# Patient Record
Sex: Male | Born: 1960 | Race: White | Hispanic: No | Smoking: Former smoker
Health system: Southern US, Community
[De-identification: ages and names within clinical notes are randomized; demographics above are authoritative.]

## PROBLEM LIST (undated history)

## (undated) DIAGNOSIS — I480 Paroxysmal atrial fibrillation: Secondary | ICD-10-CM

## (undated) DIAGNOSIS — F419 Anxiety disorder, unspecified: Secondary | ICD-10-CM

## (undated) DIAGNOSIS — R0602 Shortness of breath: Secondary | ICD-10-CM

## (undated) DIAGNOSIS — R42 Dizziness and giddiness: Secondary | ICD-10-CM

## (undated) DIAGNOSIS — I491 Atrial premature depolarization: Secondary | ICD-10-CM

## (undated) DIAGNOSIS — R5383 Other fatigue: Secondary | ICD-10-CM

## (undated) DIAGNOSIS — R079 Chest pain, unspecified: Secondary | ICD-10-CM

## (undated) HISTORY — PX: BACK SURGERY: SHX140

## (undated) HISTORY — DX: Atrial premature depolarization: I49.1

## (undated) HISTORY — PX: HERNIA REPAIR: SHX51

## (undated) HISTORY — DX: Shortness of breath: R06.02

## (undated) HISTORY — PX: INGUINAL HERNIA REPAIR: SUR1180

## (undated) HISTORY — DX: Paroxysmal atrial fibrillation: I48.0

## (undated) HISTORY — DX: Other fatigue: R53.83

## (undated) HISTORY — PX: MOHS SURGERY: SHX181

## (undated) HISTORY — DX: Dizziness and giddiness: R42

## (undated) HISTORY — PX: COLONOSCOPY: SHX174

## (undated) HISTORY — DX: Anxiety disorder, unspecified: F41.9

## (undated) HISTORY — PX: LUMBAR DISC SURGERY: SHX700

## (undated) HISTORY — DX: Chest pain, unspecified: R07.9

---

## 2000-09-21 ENCOUNTER — Emergency Department (HOSPITAL_COMMUNITY): Admission: EM | Admit: 2000-09-21 | Discharge: 2000-09-21 | Payer: Self-pay | Admitting: Emergency Medicine

## 2001-01-21 ENCOUNTER — Encounter: Payer: Self-pay | Admitting: Specialist

## 2001-01-21 ENCOUNTER — Ambulatory Visit (HOSPITAL_COMMUNITY): Admission: RE | Admit: 2001-01-21 | Discharge: 2001-01-21 | Payer: Self-pay | Admitting: Specialist

## 2003-10-01 ENCOUNTER — Emergency Department (HOSPITAL_COMMUNITY): Admission: EM | Admit: 2003-10-01 | Discharge: 2003-10-01 | Payer: Self-pay | Admitting: Emergency Medicine

## 2003-10-01 ENCOUNTER — Emergency Department (HOSPITAL_COMMUNITY): Admission: EM | Admit: 2003-10-01 | Discharge: 2003-10-02 | Payer: Self-pay | Admitting: Emergency Medicine

## 2005-11-14 ENCOUNTER — Encounter: Admission: RE | Admit: 2005-11-14 | Discharge: 2005-11-14 | Payer: Self-pay | Admitting: Family Medicine

## 2009-05-14 ENCOUNTER — Emergency Department (HOSPITAL_COMMUNITY): Admission: EM | Admit: 2009-05-14 | Discharge: 2009-05-14 | Payer: Self-pay | Admitting: Emergency Medicine

## 2009-05-16 ENCOUNTER — Ambulatory Visit: Payer: Self-pay | Admitting: Internal Medicine

## 2009-05-16 DIAGNOSIS — R079 Chest pain, unspecified: Secondary | ICD-10-CM | POA: Insufficient documentation

## 2009-05-16 DIAGNOSIS — R002 Palpitations: Secondary | ICD-10-CM | POA: Insufficient documentation

## 2009-05-16 DIAGNOSIS — I471 Supraventricular tachycardia: Secondary | ICD-10-CM | POA: Insufficient documentation

## 2009-05-21 ENCOUNTER — Telehealth (INDEPENDENT_AMBULATORY_CARE_PROVIDER_SITE_OTHER): Payer: Self-pay | Admitting: *Deleted

## 2009-05-22 ENCOUNTER — Ambulatory Visit: Payer: Self-pay

## 2009-05-22 ENCOUNTER — Ambulatory Visit: Payer: Self-pay | Admitting: Cardiovascular Disease

## 2009-05-22 ENCOUNTER — Ambulatory Visit: Payer: Self-pay | Admitting: Internal Medicine

## 2009-05-22 ENCOUNTER — Encounter (HOSPITAL_COMMUNITY): Admission: RE | Admit: 2009-05-22 | Discharge: 2009-08-01 | Payer: Self-pay | Admitting: Internal Medicine

## 2009-05-24 ENCOUNTER — Telehealth (INDEPENDENT_AMBULATORY_CARE_PROVIDER_SITE_OTHER): Payer: Self-pay | Admitting: *Deleted

## 2009-05-24 DIAGNOSIS — R9431 Abnormal electrocardiogram [ECG] [EKG]: Secondary | ICD-10-CM | POA: Insufficient documentation

## 2009-05-24 LAB — CONVERTED CEMR LAB
Cholesterol: 180 mg/dL (ref 0–200)
HDL: 34.4 mg/dL — ABNORMAL LOW (ref >39.00)
LDL Cholesterol: 121 mg/dL — ABNORMAL HIGH (ref 0–99)
TSH: 3.52 microintl units/mL (ref 0.35–5.50)
Total CHOL/HDL Ratio: 5
Triglycerides: 123 mg/dL (ref 0.0–149.0)
VLDL: 24.6 mg/dL (ref 0.0–40.0)

## 2009-06-07 ENCOUNTER — Encounter: Payer: Self-pay | Admitting: Internal Medicine

## 2009-06-07 ENCOUNTER — Ambulatory Visit: Payer: Self-pay

## 2009-06-07 ENCOUNTER — Ambulatory Visit (HOSPITAL_COMMUNITY): Admission: RE | Admit: 2009-06-07 | Discharge: 2009-06-07 | Payer: Self-pay | Admitting: Internal Medicine

## 2009-06-07 ENCOUNTER — Ambulatory Visit: Payer: Self-pay | Admitting: Cardiology

## 2009-07-03 ENCOUNTER — Ambulatory Visit: Payer: Self-pay | Admitting: Internal Medicine

## 2010-05-28 NOTE — Assessment & Plan Note (Signed)
Summary: Cardiology Nuclear Study  Nuclear Med Background Indications for Stress Test: Evaluation for Ischemia, Post Hospital  Indications Comments: 05/14/09 [facility]ED with palps and chest pain   History Comments: NO DOCUMENTED CAD  Symptoms: Chest Pain, Fatigue, Light-Headedness, Palpitations  Symptoms Comments: Last episode of ZO:XWRU since discharge.   Nuclear Pre-Procedure Cardiac Risk Factors: Lipids Caffeine/Decaff Intake: None NPO After: 12:00 AM Lungs: Clear IV 0.9% NS with Angio Cath: 20g     IV Site: (R) AC IV Started by: Irean Hong RN Chest Size (in) 45     Height (in): 75 Weight (lb): 232 BMI: 29.10 Tech Comments: Family history of father with WPW  Nuclear Med Study 1 or 2 day study:  1 day     Stress Test Type:  Stress Reading MD:  Charlton Haws, MD     Referring MD:  Berton Mount, MD Resting Radionuclide:  Technetium 62m Tetrofosmin     Resting Radionuclide Dose:  10.4 mCi  Stress Radionuclide:  Technetium 72m Tetrofosmin     Stress Radionuclide Dose:  33.0 mCi   Stress Protocol Exercise Time (min):  10:00 min     Max HR:  166 bpm     Predicted Max HR:  173 bpm  Max Systolic BP: 180 mm Hg     Percent Max HR:  95.95 %     METS: 12.0 Rate Pressure Product:  04540    Stress Test Technologist:  Rea College CMA-N     Nuclear Technologist:  Burna Mortimer Deal RT-N  Rest Procedure  Myocardial perfusion imaging was performed at rest 45 minutes following the intravenous administration of Myoview Technetium 38m Tetrofosmin.  Stress Procedure  The patient exercised for ten minutes.  The patient stopped due to fatigue and denied any chest pain.  There were no diagnostic ST-T wave changes, only nonspecific upsloping and rare PAC's.  Myoview was injected at peak exercise and myocardial perfusion imaging was performed after a brief delay.  QPS Raw Data Images:  Normal; no motion artifact; normal heart/lung ratio. Stress Images:  NI: Uniform and normal uptake of tracer in all  myocardial segments. Rest Images:  Normal homogeneous uptake in all areas of the myocardium. Subtraction (SDS):  Normal Transient Ischemic Dilatation:  .99  (Normal <1.22)  Lung/Heart Ratio:  .34  (Normal <0.45)  Quantitative Gated Spect Images QGS EDV:  144 ml QGS ESV:  71 ml QGS EF:  50 % QGS cine images:  Apical hypokinesis  Findings Low risk nuclear study   Evidence for LV Dysfunction LV Dysfunction    Overall Impression  Exercise Capacity: Good exercise capacity. BP Response: Normal blood pressure response. Clinical Symptoms: Fatigue ECG Impression: No significant ST segment change suggestive of ischemia. Overall Impression: No ischemia or infarction but decreased EF Overall Impression Comments: Suggest echo or MRI correlation of EF LVH on ECG  Appended Document: Cardiology Nuclear Study Pt aware. ECHO ordered per Dr. Graciela Husbands

## 2010-05-28 NOTE — Progress Notes (Signed)
  Phone Note Outgoing Call   Call placed by: Duncan Dull, RN, BSN,  May 24, 2009 2:45 PM Call placed to: Patient Summary of Call: s/w wife to give her the labs results and Nuc Study results. Pt needs an ECHO per Dr. Graciela Husbands  New Problems: ABNORMAL STRESS ELECTROCARDIOGRAM (ICD-794.31)   New Problems: ABNORMAL STRESS ELECTROCARDIOGRAM (ICD-794.31)

## 2010-05-28 NOTE — Assessment & Plan Note (Signed)
Summary: nep/palps/see er visit/jss   CC:  nep/palpitations. Pt states that on the 17th he had palpitations that would not go away.  He says these episodes are usually infrequent and very short lived.  Marland Kitchen  History of Present Illness: we are asked to see Larry Mathis today at the request of the emergency room because of palpitations.  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 He describes his "as a pause". They would occur a couple of times over over a span of a week or 2 and it would be quiet for a month or 2.  Earlier this week he had the onset of palpitations that were very much similar but a much more rapid and persistent frequency than previously. It was associated with a dull chest discomfort that did radiated into the axilla but not the jaw or the shoulder and was not associated with diaphoresis shortness of breath or lightheadedness.  he has no exertional chest discomfort although his work is primarily anaerobic. There is no family history of coronary disease.  He also has a history of intermittent and non-predictable lightheadedness.  His wife says that his recovery following exercise and heavy weight lifting a somewhat longer. He denies any change in her aerobic activity. There've been no peripheral edema nocturnal dyspnea  Current Medications (verified): 1)  Fish Oil 1000 Mg Caps (Omega-3 Fatty Acids) .... Once Daily 2)  Glucosamine 500 Mg Caps (Glucosamine Sulfate) .... Once Daily 3)  Multi-Enzyme  Tabs (Digestive Enzymes) .... Once Daily 4)  Saw Palmetto 450 Mg Caps (Saw Palmetto (Serenoa Repens)) .... Once Daily 5)  Dhea 25 Mg Caps (Prasterone (Dhea)) .... 3 Times Weekly 6)  Vita-Plus E 400 Unit Caps (Vitamin E) .... Once Daily  Allergies (verified): No Known Drug Allergies  Past History:  Past Medical History: Last updated: 05/15/2009 palpitations  Social History: Last updated:  05/16/2009 Tobacco Use - No.  Alcohol Use - yes-occasional Drug Use - no married without children  Past Surgical History: back surgery December 1995  Family History: Father: WPW  Social History: Tobacco Use - No.  Alcohol Use - yes-occasional Drug Use - no married without children  Review of Systems       full review of systems was negative apart from a history of present illness and past medical history.   Vital Signs:  Patient profile:   50 year old male Height:      75 inches Weight:      236 pounds BMI:     29.60 Pulse rate:   66 / minute Pulse rhythm:   irregular BP sitting:   130 / 74  (right arm) Cuff size:   large  Vitals Entered By: Larry Mathis CMA (May 16, 2009 10:36 AM)  Physical Exam  General:  Alert and oriented very well-developed middle-age Caucasian male appearing his stated age in no acute distress. HEENT  normal . Neck veins were flat; carotids brisk and full without bruits. No lymphadenopathy.No thyromegaly. No CVA tenderness. Back without kyphosis. Lungs clear. Heart sounds regular without murmurs or gallops. PMI nondisplaced. Abdomen soft with active bowel sounds without midline pulsation or hepatomegaly. Femoral pulses and distal pulses intact. Extremities were without clubbing cyanosis or edemaSkin warm and dry. Neurological exam grossly normal; affect was normal    EKG  Procedure date:  05/16/2009  Findings:      sinus rhythm at 66 Intervals 0.13/0.09/0.37 Axis  LXXXIV No evidence of ventricular preexcitation  Impression & Recommendations:  Problem # 1:  PALPITATIONS (ICD-785.1) By description the patient has premature ventricular contractions. The patient is quite anxious about them. We will plan to undertake an event recorder to try to clarify the mechanisms of this.  In the event that we find him p.r.n. medication might make the most sense given the infrequency of severe symptoms.   Orders: EKG w/ Interpretation  (93000) Nuclear Stress Test (Nuc Stress Test) Event (Event)  Problem # 2:  CHEST PAIN (ICD-786.50) I suspect the chest pain was related to the arrhythmia and is not related to coronary artery disease. However, given the Q waves on his lipid cardiogram and his age we will plan to undertake stress testing. There is minor baseline ST segment changes which might make interpretation of a non-imaging study difficult.  Problem # 3:  Family Hx of WPW (ICD-426.7) given his family history of W PW, we have to be concerned in this patient as well as his brother that reentrant tachycardia is possible. It certainly does not sound like that  Patient Instructions: 1)  Your physician has requested that you have an exercise stress myoview.  For further information please visit [email or web address]  Please follow instruction sheet, as given. 2)  Your physician recommends that you return for lab ON THE DAY OF YOUR STRESS TEST: FASTING LIPIDS, TSH 3)  Your physician has recommended that you wear an event monitor ([employer] HE[employer]).  Event monitors are medical devices that record the heart's electrical activity. Doctors most often use these monitors to diagnose arrhythmias. Arrhythmias are problems with the speed or rhythm of the heartbeat. The monitor is a small, portable device. You can wear one while you do your normal daily activities. This is usually used to diagnose what is causing palpitations/syncope (passing out). 4)  Your physician recommends that you schedule a follow-up appointment in: 5 WEEKS

## 2010-05-28 NOTE — Assessment & Plan Note (Signed)
Summary: ok per mel/per check out/saf   CC:  rov/ pt follow up on monitor/.  History of Present Illness: Ms. Hessling is seen in followup for palpitations and atypical chest pain.  Since he was seen in January he underwent nuclear testing which demonstrated no ischemia but a concern for left ventricular dysfunction with a measured ejection fraction of 50%. Ultrasound was then undertaken to clarify ejection fraction it was demonstrated at 60%.  He is otherwise without symptoms  Current Medications (verified): 1)  Fish Oil 1000 Mg Caps (Omega-3 Fatty Acids) .... Once Daily 2)  Glucosamine 500 Mg Caps (Glucosamine Sulfate) .... Once Daily 3)  Multi-Enzyme  Tabs (Digestive Enzymes) .... Once Daily 4)  Saw Palmetto 450 Mg Caps (Saw Palmetto (Serenoa Repens)) .... Once Daily 5)  Dhea 25 Mg Caps (Prasterone (Dhea)) .... 3 Times Weekly 6)  Vita-Plus E 400 Unit Caps (Vitamin E) .... Once Daily  Allergies (verified): No Known Drug Allergies  Past History:  Past Medical History: Last updated: 05/15/2009 palpitations  Past Surgical History: Last updated: 05/16/2009 back surgery December 1995  Family History: Last updated: 05/16/2009 Father: WPW  Social History: Last updated: 05/16/2009 Tobacco Use - No.  Alcohol Use - yes-occasional Drug Use - no married without children  Vital Signs:  Patient profile:   50 year old male Height:      75 inches Weight:      239 pounds BMI:     29.98 Pulse rate:   72 / minute Pulse rhythm:   regular BP sitting:   128 / 74  (left arm) Cuff size:   large  Vitals Entered By: Judithe Modest CMA (July 03, 2009 10:00 AM)  Physical Exam  General:  The patient was alert and oriented in no acute distress. HEENT Normal.  Neck veins were flat, carotids were brisk.  Lungs were clear.  Heart sounds were regular without murmurs or gallops.  Abdomen was soft with active bowel sounds. There is no clubbing cyanosis or edema. Skin Warm and  dry    Impression & Recommendations:  Problem # 1:  PALPITATIONS (ICD-785.1) these do not correlate with PACs. We discussed the role of caffeine stress as potential triggers.  Problem # 2:  CHEST PAIN (ICD-786.50) Myoview scan was negative. EF was reported as low. Echo clarified normal left ventricularfunction

## 2010-05-28 NOTE — Progress Notes (Signed)
Summary: Nuclear Pre-Procedure  Phone Note Outgoing Call Call back at [facility] Phone [phone]   Call placed by: Stanton Kidney, EMT-P,  May 21, 2009 2:58 PM Action Taken: Phone Call Completed Summary of Call: Reviewed information on Myoview Information Sheet (see scanned document for further details).  Spoke with Patient's wife.    Nuclear Med Background Indications for Stress Test: Evaluation for Ischemia     Symptoms: Chest Pain, Light-Headedness, Palpitations    Nuclear Pre-Procedure Height (in): 75

## 2013-08-23 ENCOUNTER — Encounter: Payer: Self-pay | Admitting: *Deleted

## 2013-08-26 ENCOUNTER — Ambulatory Visit (INDEPENDENT_AMBULATORY_CARE_PROVIDER_SITE_OTHER): Payer: BC Managed Care – PPO | Admitting: Internal Medicine

## 2013-08-26 ENCOUNTER — Encounter: Payer: Self-pay | Admitting: *Deleted

## 2013-08-26 ENCOUNTER — Encounter: Payer: Self-pay | Admitting: Internal Medicine

## 2013-08-26 VITALS — BP 131/84 | HR 68 | Ht 75.0 in | Wt 222.0 lb

## 2013-08-26 DIAGNOSIS — R002 Palpitations: Secondary | ICD-10-CM

## 2013-08-26 NOTE — Patient Instructions (Addendum)
Your physician recommends that you continue on your current medications as directed. Please refer to the Current Medication list given to you today.  Your physician recommends that you have CT calcium score test performed. (follow up to be determined after testing)

## 2013-08-26 NOTE — Progress Notes (Signed)
.  sfk      Patient Care Team: Doreene Nest, MD as PCP - General (Pediatrics)   HPI  Larry Mathis is a 53 y.o. male Seen after a hiatus of a number of years because of recurrent palpitations. Although they are not available, the patient recalls me having told him that monitoring demonstrated PaCs.  He is having recurrent palpitations over the last 9 months. which are not necessarily related to exertion. They're occasionally accompanied by lightheadedness.  They're much less problematic since his boss retired a few months ago. He is also decreased his coffee. He is working on keeping well hydrated as this seems to make it worse.  On some occasions he has noted discomfort in his left shoulder radiating into his left neck. This may be related to palpitations. There is some residual chest soreness.  He has lost 20 pounds or so; he has stopped snoring but still has daytime somnolence. He occasionally falls asleep at the table even with company.  There has been a fair amount of lassitude and lack of enthusiasm. His wife is asked on occasion whether he is depressed. Past Medical History  Diagnosis Date  . Arrhythmia   . Chest pain   . Rapid heart rate   . Lightheadedness   . SOB (shortness of breath)   . Dizziness   . Fatigue   . Anxiety     Past Surgical History  Procedure Laterality Date  . Back surgery      No current outpatient prescriptions on file.   No current facility-administered medications for this visit.   Fhx neg for heart dis  Soc hx  Works at [employer] negative except from HPI and PMH  Physical Exam BP 131/84  Pulse 68  Ht 6\' 3"  (1.905 m)  Wt 222 lb (100.699 kg)  BMI 27.75 kg/m2 Well developed and nourished in no acute distress HENT normal Neck supple with JVP-flat Clear Regular rate and rhythm, no murmurs or gallops Abd-soft with active BS No Clubbing cyanosis edema Skin-warm and dry A & Oriented  Grossly normal  sensory and motor function  ECG demonstrates sinus rhythm at 57 Intervals 13/09/40  Assessment and  Plan  Palpitations  Fatigue  Chest pain -atypical  Daytime somnolence  The patient has a history of documented PACs causing palpitations. His symptoms are similar or perhaps more severe of late. They are largely attenuated however, with the recent retirement of his boss. He is also decrease his caffeine intake. This is very reassuring. I suspect they're PACs again or PVCs. In either case he is reassured.   He has atypical chest pain. Inferolateral Q waves are old and would have no impact on stress testing. However, we have discussed also calcium scoring  ; we will pursue that. In the event that that is not normal, we'll undertake standard stress testing  I've also asked him to consider the role of a sleep study given daytime somnolence not withstanding the fact that there is less no results he is lost. In addition, I have asked him as wife to consider whether depression may explain some of the lassitude of life over the last month. He is feeling better as spring  has come forth and with the departure of his boss.  If the symptoms persist he should follow this up with his PCP

## 2013-09-02 ENCOUNTER — Ambulatory Visit (INDEPENDENT_AMBULATORY_CARE_PROVIDER_SITE_OTHER)
Admission: RE | Admit: 2013-09-02 | Discharge: 2013-09-02 | Disposition: A | Discharge status: Home / Self Care | Payer: Self-pay | Source: Ambulatory Visit | Attending: Internal Medicine | Admitting: Internal Medicine

## 2013-09-02 DIAGNOSIS — R002 Palpitations: Secondary | ICD-10-CM

## 2013-09-07 ENCOUNTER — Telehealth: Payer: Self-pay | Admitting: Internal Medicine

## 2013-09-07 NOTE — Telephone Encounter (Signed)
New message    Test results from last Friday.

## 2013-09-07 NOTE — Telephone Encounter (Signed)
Called patient with the calcium CT score ( he will let his wife know). Not bothered by PVC's now. Will call us if he has increased PVC's. Does not want to set up sleep study at this time.

## 2016-08-18 ENCOUNTER — Ambulatory Visit (INDEPENDENT_AMBULATORY_CARE_PROVIDER_SITE_OTHER): Payer: BC Managed Care – PPO | Admitting: Family Medicine

## 2016-08-18 VITALS — BP 110/72 | HR 71 | Temp 98.2°F | Resp 17 | Ht 75.0 in | Wt 215.0 lb

## 2016-08-18 DIAGNOSIS — R002 Palpitations: Secondary | ICD-10-CM | POA: Diagnosis not present

## 2016-08-18 DIAGNOSIS — K4090 Unilateral inguinal hernia, without obstruction or gangrene, not specified as recurrent: Secondary | ICD-10-CM | POA: Diagnosis not present

## 2016-08-18 NOTE — Patient Instructions (Addendum)
The area on your right lower abdomen does appear to be a hernia. I do not see any concerns with it at this time. Avoid heavy lifting as much as possible until you're seen by general surgery. See other precautions below.  For your heart palpitations, I would recommend following up with your cardiologist. If those worsen, or associated with lightheadedness or dizziness, be seen immediately here or the emergency room if needed.  Hernia, Adult A hernia is the bulging of an organ or tissue through a weak spot in the muscles of the abdomen (abdominal wall). Hernias develop most often near the navel or groin. There are many kinds of hernias. Common kinds include:  Femoral hernia. This kind of hernia develops under the groin in the upper thigh area.  Inguinal hernia. This kind of hernia develops in the groin or scrotum.  Umbilical hernia. This kind of hernia develops near the navel.  Hiatal hernia. This kind of hernia causes part of the stomach to be pushed up into the chest.  Incisional hernia. This kind of hernia bulges through a scar from an abdominal surgery. What are the causes? This condition may be caused by:  Heavy lifting.  Coughing over a long period of time.  Straining to have a bowel movement.  An incision made during an abdominal surgery.  A birth defect (congenital defect).  Excess weight or obesity.  Smoking.  Poor nutrition.  Cystic fibrosis.  Excess fluid in the abdomen.  Undescended testicles. What are the signs or symptoms? Symptoms of a hernia include:  A lump on the abdomen. This is the first sign of a hernia. The lump may become more obvious with standing, straining, or coughing. It may get bigger over time if it is not treated or if the condition causing it is not treated.  Pain. A hernia is usually painless, but it may become painful over time if treatment is delayed. The pain is usually dull and may get worse with standing or lifting heavy  objects. Sometimes a hernia gets tightly squeezed in the weak spot (strangulated) or stuck there (incarcerated) and causes additional symptoms. These symptoms may include:  Vomiting.  Nausea.  Constipation.  Irritability. How is this diagnosed? A hernia may be diagnosed with:  A physical exam. During the exam your health care provider may ask you to cough or to make a specific movement, because a hernia is usually more visible when you move.  Imaging tests. These can include:  X-rays.  Ultrasound.  CT scan. How is this treated? A hernia that is small and painless may not need to be treated. A hernia that is large or painful may be treated with surgery. Inguinal hernias may be treated with surgery to prevent incarceration or strangulation. Strangulated hernias are always treated with surgery, because lack of blood to the trapped organ or tissue can cause it to die. Surgery to treat a hernia involves pushing the bulge back into place and repairing the weak part of the abdomen. Follow these instructions at home:  Avoid straining.  Do not lift anything heavier than 10 lb (4.5 kg).  Lift with your leg muscles, not your back muscles. This helps avoid strain.  When coughing, try to cough gently.  Prevent constipation. Constipation leads to straining with bowel movements, which can make a hernia worse or cause a hernia repair to break down. You can prevent constipation by:  Eating a high-fiber diet that includes plenty of fruits and vegetables.  Drinking enough fluids to keep  your urine clear or pale yellow. Aim to drink 6-8 glasses of water per day.  Using a stool softener as directed by your health care provider.  Lose weight, if you are overweight.  Do not use any tobacco products, including cigarettes, chewing tobacco, or electronic cigarettes. If you need help quitting, ask your health care provider.  Keep all follow-up visits as directed by your health care provider. This  is important. Your health care provider may need to monitor your condition. Contact a health care provider if:  You have swelling, redness, and pain in the affected area.  Your bowel habits change. Get help right away if:  You have a fever.  You have abdominal pain that is getting worse.  You feel nauseous or you vomit.  You cannot push the hernia back in place by gently pressing on it while you are lying down.  The hernia:  Changes in shape or size.  Is stuck outside the abdomen.  Becomes discolored.  Feels hard or tender. This information is not intended to replace advice given to you by your health care provider. Make sure you discuss any questions you have with your health care provider. Document Released: 04/14/2005 Document Revised: 09/12/2015 Document Reviewed: 02/22/2014 Elsevier Interactive Patient Education  2017 [employer].    Palpitations A palpitation is the feeling that your heartbeat is irregular or is faster than normal. It may feel like your heart is fluttering or skipping a beat. Palpitations are usually not a serious problem. They may be caused by many things, including smoking, caffeine, alcohol, stress, and certain medicines. Although most causes of palpitations are not serious, palpitations can be a sign of a serious medical problem. In some cases, you may need further medical evaluation. Follow these instructions at home: Pay attention to any changes in your symptoms. Take these actions to help with your condition:  Avoid the following:  Caffeinated coffee, tea, soft drinks, diet pills, and energy drinks.  Chocolate.  Alcohol.  Do not use any tobacco products, such as cigarettes, chewing tobacco, and e-cigarettes. If you need help quitting, ask your health care provider.  Try to reduce your stress and anxiety. Things that can help you relax include:  Yoga.  Meditation.  Physical activity, such as swimming, jogging, or  walking.  Biofeedback. This is a method that helps you learn to use your mind to control things in your body, such as your heartbeats.  Get plenty of rest and sleep.  Take over-the-counter and prescription medicines only as told by your health care provider.  Keep all follow-up visits as told by your health care provider. This is important. Contact a health care provider if:  You continue to have a fast or irregular heartbeat after 24 hours.  Your palpitations occur more often. Get help right away if:  You have chest pain or shortness of breath.  You have a severe headache.  You feel dizzy or you faint. This information is not intended to replace advice given to you by your health care provider. Make sure you discuss any questions you have with your health care provider. Document Released: 04/11/2000 Document Revised: 09/17/2015 Document Reviewed: 12/28/2014 Elsevier Interactive Patient Education  2017 [employer].   IF you received an x-ray today, you will receive an invoice from [facility]. Please contact [facility] at [phone] with questions or concerns regarding your invoice.   IF you received labwork today, you will receive an invoice from [facility]. Please contact [facility] at [phone] with  questions or concerns regarding your invoice.   Our billing staff will not be able to assist you with questions regarding bills from these companies.  You will be contacted with the lab results as soon as they are available. The fastest way to get your results is to activate your My Chart account. Instructions are located on the last page of this paperwork. If you have not heard from [place] regarding the results in 2 weeks, please contact this office.

## 2016-08-18 NOTE — Progress Notes (Signed)
By signing my name below, I, Mesha Guinyard, attest that this documentation has been prepared under the direction and in the presence of Merri Ray, MD.  Electronically Signed: Verlee Monte, Medical Scribe. 08/18/16. 11:26 AM.  Subjective:    Patient ID: Larry Mathis, male    DOB: [birth year], 56 y.o.   MRN: [id number]  HPI Chief Complaint  Patient presents with  . Edema    rt inquinal area onset 3 days    HPI Comments: Larry Mathis is a 56 y.o. male who presents to the Primary Care at [facility] complaining of sore right inguinal nodule onset 2 days ago. Pt noticed it after doing housework - nothing strenuous activity, moved it around and pushed it "back in". Mentions it was initially non-painful, but became sore after moving it around and pushing it in it. He has not tried any medication or treatment for his sxs. Denies PMHx of hernias. Denies N/V/D, abdominal pain, fever, trouble urinating, and rash/skin changes.  Palpitations: He was evaluated by cardiology in the past and noted to have PACs. He still hs occasional palpitations with brief light-headedness. Currently feels well. Plans on follow-up with cardiology.  Patient Active Problem List   Diagnosis Date Noted  . ABNORMAL STRESS ELECTROCARDIOGRAM 05/24/2009  . PALPITATIONS 05/16/2009  . CHEST PAIN 05/16/2009   Past Medical History:  Diagnosis Date  . Anxiety   . Chest pain   . Fatigue   . Lightheadedness   . PAC (premature atrial contraction)   . SOB (shortness of breath)    Past Surgical History:  Procedure Laterality Date  . BACK SURGERY     No Known Allergies Prior to Admission medications   Not on File   Social History   Social History  . Marital status: Married    Spouse name: N/A  . Number of children: N/A  . Years of education: N/A   Occupational History  . Not on file.   Social History Main Topics  . Smoking status: Never Smoker  . Smokeless tobacco: Former Systems developer     Types: Snuff, Chew    Quit date: 08/19/1986  . Alcohol use Not on file  . Drug use: Unknown  . Sexual activity: Not on file   Other Topics Concern  . Not on file   Social History Narrative  . No narrative on file   Review of Systems  Constitutional: Negative for fever.  Gastrointestinal: Negative for abdominal pain, diarrhea, nausea and vomiting.  Genitourinary: Negative for difficulty urinating.  Musculoskeletal:       Positive for nodule in inguinal area  Skin: Negative for color change and rash.   Objective:  Physical Exam  Constitutional: He appears well-developed and well-nourished. No distress.  HENT:  Head: Normocephalic and atraumatic.  Eyes: Conjunctivae are normal.  Neck: Neck supple.  Cardiovascular: Normal rate and regular rhythm.  Exam reveals no gallop and no friction rub.   No murmur heard. Pulmonary/Chest: Effort normal. He has no wheezes. He has no rhonchi. He has no rales.  Abdominal: Soft. There is no tenderness.  Musculoskeletal:  Right inguinal hernia, easily reducible  Neurological: He is alert.  Skin: Skin is warm and dry. No ecchymosis and no rash noted. No erythema.  No skin chnages  Psychiatric: He has a normal mood and affect. His behavior is normal.  Nursing note and vitals reviewed.   Vitals:   08/18/16 1054  BP: (!) 139/91  Pulse: 71  Resp: 17  Temp: 98.2 F (36.8 C)  TempSrc: Oral  SpO2: 100%  Weight: 215 lb (97.5 kg)  Height: 6\' 3"  (1.905 m)  Body mass index is 26.87 kg/m. Assessment & Plan:   Larry Mathis is a 56 y.o. male Non-recurrent unilateral inguinal hernia without obstruction or gangrene - Plan: Ambulatory referral to General Surgery, Care order/instruction:  - New onset right inguinal hernia without concerning findings on exam. Handout given, refer to Gen. surgery for evaluation. Avoid heavy lifting for now.  Palpitations  - Long-standing symptoms, suspected PACs. Asymptomatic at present. Advised to follow-up  with cardiology if symptoms persist, RTC/ER precautions.  No orders of the defined types were placed in this encounter.  Patient Instructions   The area on your right lower abdomen does appear to be a hernia. I do not see any concerns with it at this time. Avoid heavy lifting as much as possible until you're seen by general surgery. See other precautions below.  For your heart palpitations, I would recommend following up with your cardiologist. If those worsen, or associated with lightheadedness or dizziness, be seen immediately here or the emergency room if needed.  Hernia, Adult A hernia is the bulging of an organ or tissue through a weak spot in the muscles of the abdomen (abdominal wall). Hernias develop most often near the navel or groin. There are many kinds of hernias. Common kinds include:  Femoral hernia. This kind of hernia develops under the groin in the upper thigh area.  Inguinal hernia. This kind of hernia develops in the groin or scrotum.  Umbilical hernia. This kind of hernia develops near the navel.  Hiatal hernia. This kind of hernia causes part of the stomach to be pushed up into the chest.  Incisional hernia. This kind of hernia bulges through a scar from an abdominal surgery. What are the causes? This condition may be caused by:  Heavy lifting.  Coughing over a long period of time.  Straining to have a bowel movement.  An incision made during an abdominal surgery.  A birth defect (congenital defect).  Excess weight or obesity.  Smoking.  Poor nutrition.  Cystic fibrosis.  Excess fluid in the abdomen.  Undescended testicles. What are the signs or symptoms? Symptoms of a hernia include:  A lump on the abdomen. This is the first sign of a hernia. The lump may become more obvious with standing, straining, or coughing. It may get bigger over time if it is not treated or if the condition causing it is not treated.  Pain. A hernia is usually painless,  but it may become painful over time if treatment is delayed. The pain is usually dull and may get worse with standing or lifting heavy objects. Sometimes a hernia gets tightly squeezed in the weak spot (strangulated) or stuck there (incarcerated) and causes additional symptoms. These symptoms may include:  Vomiting.  Nausea.  Constipation.  Irritability. How is this diagnosed? A hernia may be diagnosed with:  A physical exam. During the exam your health care provider may ask you to cough or to make a specific movement, because a hernia is usually more visible when you move.  Imaging tests. These can include:  X-rays.  Ultrasound.  CT scan. How is this treated? A hernia that is small and painless may not need to be treated. A hernia that is large or painful may be treated with surgery. Inguinal hernias may be treated with surgery to prevent incarceration or strangulation. Strangulated hernias are always treated  with surgery, because lack of blood to the trapped organ or tissue can cause it to die. Surgery to treat a hernia involves pushing the bulge back into place and repairing the weak part of the abdomen. Follow these instructions at home:  Avoid straining.  Do not lift anything heavier than 10 lb (4.5 kg).  Lift with your leg muscles, not your back muscles. This helps avoid strain.  When coughing, try to cough gently.  Prevent constipation. Constipation leads to straining with bowel movements, which can make a hernia worse or cause a hernia repair to break down. You can prevent constipation by:  Eating a high-fiber diet that includes plenty of fruits and vegetables.  Drinking enough fluids to keep your urine clear or pale yellow. Aim to drink 6-8 glasses of water per day.  Using a stool softener as directed by your health care provider.  Lose weight, if you are overweight.  Do not use any tobacco products, including cigarettes, chewing tobacco, or electronic  cigarettes. If you need help quitting, ask your health care provider.  Keep all follow-up visits as directed by your health care provider. This is important. Your health care provider may need to monitor your condition. Contact a health care provider if:  You have swelling, redness, and pain in the affected area.  Your bowel habits change. Get help right away if:  You have a fever.  You have abdominal pain that is getting worse.  You feel nauseous or you vomit.  You cannot push the hernia back in place by gently pressing on it while you are lying down.  The hernia:  Changes in shape or size.  Is stuck outside the abdomen.  Becomes discolored.  Feels hard or tender. This information is not intended to replace advice given to you by your health care provider. Make sure you discuss any questions you have with your health care provider. Document Released: 04/14/2005 Document Revised: 09/12/2015 Document Reviewed: 02/22/2014 Elsevier Interactive Patient Education  2017 [employer].    Palpitations A palpitation is the feeling that your heartbeat is irregular or is faster than normal. It may feel like your heart is fluttering or skipping a beat. Palpitations are usually not a serious problem. They may be caused by many things, including smoking, caffeine, alcohol, stress, and certain medicines. Although most causes of palpitations are not serious, palpitations can be a sign of a serious medical problem. In some cases, you may need further medical evaluation. Follow these instructions at home: Pay attention to any changes in your symptoms. Take these actions to help with your condition:  Avoid the following:  Caffeinated coffee, tea, soft drinks, diet pills, and energy drinks.  Chocolate.  Alcohol.  Do not use any tobacco products, such as cigarettes, chewing tobacco, and e-cigarettes. If you need help quitting, ask your health care provider.  Try to reduce your stress and  anxiety. Things that can help you relax include:  Yoga.  Meditation.  Physical activity, such as swimming, jogging, or walking.  Biofeedback. This is a method that helps you learn to use your mind to control things in your body, such as your heartbeats.  Get plenty of rest and sleep.  Take over-the-counter and prescription medicines only as told by your health care provider.  Keep all follow-up visits as told by your health care provider. This is important. Contact a health care provider if:  You continue to have a fast or irregular heartbeat after 24 hours.  Your palpitations occur  more often. Get help right away if:  You have chest pain or shortness of breath.  You have a severe headache.  You feel dizzy or you faint. This information is not intended to replace advice given to you by your health care provider. Make sure you discuss any questions you have with your health care provider. Document Released: 04/11/2000 Document Revised: 09/17/2015 Document Reviewed: 12/28/2014 Elsevier Interactive Patient Education  2017 [employer].   IF you received an x-ray today, you will receive an invoice from [facility]. Please contact [facility] at [phone] with questions or concerns regarding your invoice.   IF you received labwork today, you will receive an invoice from [facility]. Please contact [facility] at [phone] with questions or concerns regarding your invoice.   Our billing staff will not be able to assist you with questions regarding bills from these companies.  You will be contacted with the lab results as soon as they are available. The fastest way to get your results is to activate your My Chart account. Instructions are located on the last page of this paperwork. If you have not heard from [place] regarding the results in 2 weeks, please contact this office.      I personally performed the services described in this documentation, which was scribed  in my presence. The recorded information has been reviewed and considered for accuracy and completeness, addended by me as needed, and agree with information above.  Signed,   Merri Ray, MD Primary Care at [facility].  08/18/16 6:48 PM

## 2016-09-19 ENCOUNTER — Ambulatory Visit (INDEPENDENT_AMBULATORY_CARE_PROVIDER_SITE_OTHER): Payer: BC Managed Care – PPO | Admitting: Interventional Cardiology

## 2016-09-19 ENCOUNTER — Encounter: Payer: Self-pay | Admitting: Interventional Cardiology

## 2016-09-19 ENCOUNTER — Encounter (INDEPENDENT_AMBULATORY_CARE_PROVIDER_SITE_OTHER): Payer: Self-pay

## 2016-09-19 VITALS — BP 122/76 | HR 63 | Ht 75.0 in | Wt 215.4 lb

## 2016-09-19 DIAGNOSIS — I491 Atrial premature depolarization: Secondary | ICD-10-CM | POA: Diagnosis not present

## 2016-09-19 DIAGNOSIS — Z01810 Encounter for preprocedural cardiovascular examination: Secondary | ICD-10-CM

## 2016-09-19 DIAGNOSIS — R002 Palpitations: Secondary | ICD-10-CM

## 2016-09-19 NOTE — Progress Notes (Signed)
Cardiology Office Note    Date:  09/19/2016   ID:  Justen, Fonda Feb 15, 1961, MRN [id number]  PCP:  Doreene Nest, MD  Cardiologist: Sinclair Grooms, MD   Chief Complaint  Patient presents with  . Advice Only    Preoperative clearance    History of Present Illness:  KALID GHAN is a 56 y.o. male is referred by Dr. Ralene Ok for cardiac clearance prior to planned laparoscopic inguinal herniorrhaphy.  The patient has a history of PVCs and PACs. He was previously evaluated by Dr. Jolyn Nap. Evaluation occurred 3-5 years ago. Cardiac evaluation did not turn up any significant abnormalities.  He is very physically active. He runs 40 yard when spread's without chest discomfort or excessive dyspnea. He is physically active as well with resistance training which produces no cardiac complaints. A coronary CT score was performed a few years ago and in was 0. Normal LV size and structure was documented by echocardiogram 6 or 7 years ago. He doesn't smoke. There is no significant family history of premature atherosclerosis. His father and a brother have electrical problems related to preexcitation syndrome.  Past Medical History:  Diagnosis Date  . Anxiety   . Chest pain   . Fatigue   . Lightheadedness   . PAC (premature atrial contraction)   . SOB (shortness of breath)     Past Surgical History:  Procedure Laterality Date  . BACK SURGERY      Current Medications: No outpatient prescriptions prior to visit.   No facility-administered medications prior to visit.      Allergies:   Patient has no known allergies.   Social History   Social History  . Marital status: Married    Spouse name: N/A  . Number of children: N/A  . Years of education: N/A   Social History Main Topics  . Smoking status: Former Research scientist (life sciences)  . Smokeless tobacco: Former Systems developer    Types: Snuff, Chew    Quit date: 08/19/1986  . Alcohol use None  . Drug use: Unknown  . Sexual  activity: Not Asked   Other Topics Concern  . None   Social History Narrative  . None     Family History:  The patient's family history includes Cancer in his father and mother; Heart disease in his brother; Heart murmur in his brother; [name] Parkinson White syndrome in his father.   ROS:   Please see the history of present illness.    Right inguinal hernia. Symptomatic and to have it repaired by Dr. Rosendo Gros  All other systems reviewed and are negative.   PHYSICAL EXAM:   VS:  BP 122/76 (BP Location: Right Arm)   Pulse 63   Ht 6\' 3"  (1.905 m)   Wt 215 lb 6.4 oz (97.7 kg)   BMI 26.92 kg/m    GEN: Well nourished, well developed, in no acute distress. Tall, lean, healthy-appearing male  HEENT: normal  Neck: no JVD, carotid bruits, or masses Cardiac: RRR; no murmurs, rubs, or gallops,no edema  Respiratory:  clear to auscultation bilaterally, normal work of breathing GI: soft, nontender, nondistended, + BS MS: no deformity or atrophy  Skin: warm and dry, no rash Neuro:  Alert and Oriented x 3, Strength and sensation are intact Psych: euthymic mood, full affect  Wt Readings from Last 3 Encounters:  09/19/16 215 lb 6.4 oz (97.7 kg)  08/18/16 215 lb (97.5 kg)  08/26/13 222 lb (100.7 kg)  Studies/Labs Reviewed:   EKG:  EKG  Normal sinus rhythm with normal appearance. There is no change when compared to prior tracings.  Recent Labs: No results found for requested labs within last 8760 hours.   Lipid Panel    Component Value Date/Time   CHOL 180 05/22/2009 0842   TRIG 123.0 05/22/2009 0842   HDL 34.40 (L) 05/22/2009 0842   CHOLHDL 5 05/22/2009 0842   VLDL 24.6 05/22/2009 0842   LDLCALC 121 (H) 05/22/2009 0842    Additional studies/ records that were reviewed today include:   Calcium score 09/02/13: IMPRESSION: Coronary calcium score of 0  Echocardiography 2011: Study Conclusions  - Left ventricle: The estimated ejection fraction was 60%. Wall  motion  was normal; there were no regional wall motion  abnormalities. - Aortic valve: Mild regurgitation.   ASSESSMENT:    1. Pre-operative cardiovascular examination   2. Palpitations   3. PAC (premature atrial contraction)      PLAN:  In order of problems listed above:  1. Cleared for upcoming surgery. No cardiovascular evaluation needed and is physically active gentleman with prior negative cardiac workup within the past 10 years. His risks for cardiac complications with the plan low risk procedure is nail. 2. Previous diameter and a PACs.  Cleared for upcoming surgical procedure by Dr. Rosendo Gros.    Medication Adjustments/Labs and Tests Ordered: Current medicines are reviewed at length with the patient today.  Concerns regarding medicines are outlined above.  Medication changes, Labs and Tests ordered today are listed in the Patient Instructions below. Patient Instructions  Medication Instructions:  None  Labwork: None  Testing/Procedures: None  Follow-Up: Your physician recommends that you schedule a follow-up appointment as needed with Dr. Tamala Julian.    Any Other Special Instructions Will Be Listed Below (If Applicable).     If you need a refill on your cardiac medications before your next appointment, please call your pharmacy.      Signed, Sinclair Grooms, MD  09/19/2016 5:08 PM    [facility] [place], [place], [place]  [address] Phone: [phone]; Fax: [phone]

## 2016-09-19 NOTE — Patient Instructions (Signed)
Medication Instructions:  None  Labwork: None  Testing/Procedures: None  Follow-Up: Your physician recommends that you schedule a follow-up appointment as needed with Dr. Smith.    Any Other Special Instructions Will Be Listed Below (If Applicable).     If you need a refill on your cardiac medications before your next appointment, please call your pharmacy.   

## 2017-04-17 ENCOUNTER — Other Ambulatory Visit: Payer: Self-pay

## 2017-04-17 ENCOUNTER — Encounter (HOSPITAL_COMMUNITY): Payer: Self-pay | Admitting: Emergency Medicine

## 2017-04-17 ENCOUNTER — Emergency Department (HOSPITAL_COMMUNITY)
Admission: EM | Admit: 2017-04-17 | Discharge: 2017-04-17 | Disposition: A | Discharge status: Home / Self Care | Payer: BC Managed Care – PPO | Attending: Emergency Medicine | Admitting: Emergency Medicine

## 2017-04-17 ENCOUNTER — Emergency Department (HOSPITAL_COMMUNITY): Payer: BC Managed Care – PPO

## 2017-04-17 DIAGNOSIS — Z87891 Personal history of nicotine dependence: Secondary | ICD-10-CM | POA: Insufficient documentation

## 2017-04-17 DIAGNOSIS — I4891 Unspecified atrial fibrillation: Secondary | ICD-10-CM | POA: Insufficient documentation

## 2017-04-17 DIAGNOSIS — R002 Palpitations: Secondary | ICD-10-CM | POA: Diagnosis present

## 2017-04-17 LAB — BASIC METABOLIC PANEL
Anion gap: 10 (ref 5–15)
BUN: 15 mg/dL (ref 6–20)
CO2: 21 mmol/L — ABNORMAL LOW (ref 22–32)
Calcium: 9.2 mg/dL (ref 8.9–10.3)
Chloride: 105 mmol/L (ref 101–111)
Creatinine, Ser: 1.19 mg/dL (ref 0.61–1.24)
Glucose, Bld: 119 mg/dL — ABNORMAL HIGH (ref 65–99)
Potassium: 3.6 mmol/L (ref 3.5–5.1)
SODIUM: 136 mmol/L (ref 135–145)

## 2017-04-17 LAB — CBC
HCT: 45.7 % (ref 39.0–52.0)
Hemoglobin: 15.9 g/dL (ref 13.0–17.0)
MCH: 31.2 pg (ref 26.0–34.0)
MCHC: 34.8 g/dL (ref 30.0–36.0)
MCV: 89.8 fL (ref 78.0–100.0)
Platelets: 212 10*3/uL (ref 150–400)
RBC: 5.09 MIL/uL (ref 4.22–5.81)
RDW: 12.2 % (ref 11.5–15.5)
WBC: 6.2 10*3/uL (ref 4.0–10.5)

## 2017-04-17 LAB — I-STAT TROPONIN, ED: TROPONIN I, POC: 0 ng/mL (ref 0.00–0.08)

## 2017-04-17 MED ORDER — DILTIAZEM HCL-DEXTROSE 100-5 MG/100ML-% IV SOLN (PREMIX)
5.0000 mg/h | INTRAVENOUS | Status: DC
  Administered 2017-04-17: 5 mg/h via INTRAVENOUS
  Filled 2017-04-17: qty 100

## 2017-04-17 MED ORDER — DILTIAZEM HCL 60 MG PO TABS
120.0000 mg | ORAL_TABLET | Freq: Once | ORAL | Status: AC
  Administered 2017-04-17: 120 mg via ORAL
  Filled 2017-04-17: qty 2

## 2017-04-17 MED ORDER — SODIUM CHLORIDE 0.9 % IV BOLUS (SEPSIS): 500.0000 mL | Freq: Once | INTRAVENOUS | Status: DC

## 2017-04-17 MED ORDER — APIXABAN 5 MG PO TABS
5.0000 mg | ORAL_TABLET | Freq: Once | ORAL | Status: AC
  Administered 2017-04-17: 5 mg via ORAL
  Filled 2017-04-17: qty 1

## 2017-04-17 MED ORDER — APIXABAN 5 MG PO TABS: 5.0000 mg | ORAL_TABLET | Freq: Two times a day (BID) | ORAL | 0 refills | Status: DC

## 2017-04-17 MED ORDER — SODIUM CHLORIDE 0.9 % IV BOLUS (SEPSIS)
1000.0000 mL | Freq: Once | INTRAVENOUS | Status: AC
  Administered 2017-04-17: 1000 mL via INTRAVENOUS

## 2017-04-17 MED ORDER — DILTIAZEM LOAD VIA INFUSION
20.0000 mg | Freq: Once | INTRAVENOUS | Status: AC
  Administered 2017-04-17: 20 mg via INTRAVENOUS
  Filled 2017-04-17: qty 20

## 2017-04-17 MED ORDER — DILTIAZEM HCL ER 120 MG PO CP12: 120.0000 mg | ORAL_CAPSULE | Freq: Two times a day (BID) | ORAL | 0 refills | Status: DC

## 2017-04-17 NOTE — ED Notes (Signed)
ED Provider at bedside. 

## 2017-04-17 NOTE — ED Provider Notes (Signed)
Larry Mathis   CSN: [id number] Arrival date & time: 04/17/17  1100     History   Chief Complaint Chief Complaint  Patient presents with  . Palpitations    HPI Larry Mathis is a 56 y.o. male.  The history is provided by the patient. No language interpreter was used.  Palpitations   This is a new problem. The current episode started less than 1 hour ago. The problem occurs constantly. The problem has been rapidly worsening. The problem is associated with exercise. Associated symptoms include irregular heartbeat, dizziness and weakness. Pertinent negatives include no diaphoresis and no near-syncope. He has tried nothing for the symptoms. There are no known risk factors.  Pt reports he has seen Dr. Caryl Comes in the past for palpations.  Pt reports he wore a monitor and was diagnosed with PACs. Pt reports everything checked out normally.  Past Medical History:  Diagnosis Date  . Anxiety   . Chest pain   . Fatigue   . Lightheadedness   . PAC (premature atrial contraction)   . SOB (shortness of breath)     Patient Active Problem List   Diagnosis Date Noted  . ABNORMAL STRESS ELECTROCARDIOGRAM 05/24/2009  . PALPITATIONS 05/16/2009  . CHEST PAIN 05/16/2009    Past Surgical History:  Procedure Laterality Date  . BACK SURGERY    . HERNIA REPAIR         Home Medications    Prior to Admission medications   Not on File    Family History Family History  Problem Relation Age of Onset  . Cancer Mother        breast  . Cancer Father   . [place] White syndrome Father   . Heart disease Brother   . Heart murmur Brother     Social History Social History   Tobacco Use  . Smoking status: Former Research scientist (life sciences)  . Smokeless tobacco: Former User    Types: Snuff, Sarina Ser    Quit date: 08/19/1986  Substance Use Topics  . Alcohol use: Yes  . Drug use: No     Allergies   Patient has no known allergies.   Review of  Systems Review of Systems  Constitutional: Negative for diaphoresis.  Cardiovascular: Positive for palpitations. Negative for near-syncope.  Neurological: Positive for dizziness and weakness.  All other systems reviewed and are negative.    Physical Exam Updated Vital Signs BP 110/75   Pulse 68   Temp 98.2 F (36.8 C) (Oral)   Resp 15   Ht 6\' 2"  (1.88 m)   Wt 96.2 kg (212 lb)   SpO2 100%   BMI 27.22 kg/m   Physical Exam  Constitutional: He appears well-developed and well-nourished.  HENT:  Head: Normocephalic.  Right Ear: External ear normal.  Left Ear: External ear normal.  Eyes: Conjunctivae and EOM are normal. Pupils are equal, round, and reactive to light.  Neck: Normal range of motion. Neck supple.  Cardiovascular:  Rapid heart rate 160's  Pulmonary/Chest: Effort normal.  Abdominal: Soft.  Musculoskeletal: Normal range of motion.  Neurological: He is alert.  Skin: Skin is warm.  Psychiatric: He has a normal mood and affect.  Nursing Mathis and vitals reviewed.    ED Treatments / Results  Labs (all labs ordered are listed, but only abnormal results are displayed) Labs Reviewed  BASIC METABOLIC PANEL - Abnormal; Notable for the following components:      Result Value   CO2 21 (*)  Glucose, Bld 119 (*)    All other components within normal limits  CBC  I-STAT TROPONIN, ED    EKG  EKG Interpretation  Date/Time:  Friday April 17 2017 11:11:19 EST Ventricular Rate:  156 PR Interval:    QRS Duration: 91 QT Interval:  308 QTC Calculation: 497 R Axis:   81 Text Interpretation:  Supraventricular tachycardia Repolarization abnormality, prob rate related changed from prior Confirmed by Jola Schmidt [phone]) on 04/17/2017 11:36:15 AM       Radiology Dg Chest Port 1 View  Result Date: 04/17/2017 CLINICAL DATA:  Tachycardia EXAM: PORTABLE CHEST 1 VIEW COMPARISON:  Chest CT images from 09/02/2013 FINDINGS: The heart size and mediastinal contours are  within normal limits. Both lungs are clear. The visualized skeletal structures are unremarkable. IMPRESSION: No active disease. Electronically Signed   By: Van Clines M.D.   On: 04/17/2017 12:05   ED course: Pt awake and stable during episode.  Dr. Venora Maples in to see and examine.  Pt given cardizem bolus and started on cardizem drip.  Pt had return to normal sinus at 76.  I attempted vagal maneuvers and carotid massage without results.  Procedures Procedures (including critical care time)  Medications Ordered in ED Medications  diltiazem (CARDIZEM) 1 mg/mL load via infusion 20 mg (20 mg Intravenous Bolus from Bag 04/17/17 1145)    And  diltiazem (CARDIZEM) 100 mg in dextrose 5% 133mL (1 mg/mL) infusion (5 mg/hr Intravenous New Bag/Given 04/17/17 1146)  sodium chloride 0.9 % bolus 1,000 mL (0 mLs Intravenous Stopped 04/17/17 1300)     Initial Impression / Assessment and Plan / ED Course  I have reviewed the triage vital signs and the nursing notes.  Pertinent labs & imaging results that were available during my care of the patient were reviewed by me and considered in my medical decision making (see chart for details).     Pt advised to call Cardiologist today to shcedule to se seen for evaluation.    Final Clinical Impressions(s) / ED Diagnoses   Final diagnoses:  Atrial fibrillation with rapid ventricular response [facility])    ED Discharge Orders    None     No outpatient medications have been marked as taking for the 04/17/17 encounter [facility] Encounter).   Meds ordered this encounter  Medications  . AND Linked Order Group   . diltiazem (CARDIZEM) 1 mg/mL load via infusion 20 mg   . diltiazem (CARDIZEM) 100 mg in dextrose 5% 152mL (1 mg/mL) infusion  . sodium chloride 0.9 % bolus 1,000 mL  . DISCONTD: sodium chloride 0.9 % bolus 500 mL  . apixaban (ELIQUIS) tablet 5 mg  . diltiazem (CARDIZEM) tablet 120 mg  . apixaban (ELIQUIS) 5 MG TABS tablet    Sig: Take 1  tablet (5 mg total) by mouth 2 (two) times daily.    Dispense:  60 tablet    Refill:  0    Order Specific Question:   Supervising Provider    Answer:   MILLER, BRIAN [[id number]]  . diltiazem (CARDIZEM SR) 120 MG 12 hr capsule    Sig: Take 1 capsule (120 mg total) by mouth 2 (two) times daily.    Dispense:  60 capsule    Refill:  0    Order Specific Question:   Supervising Provider    Answer:   Noemi Chapel [[id number]]  An After Visit Summary was printed and given to the patient.   Fransico Meadow, [place] 04/17/17 6045  Jola Schmidt, MD 04/17/17 2250

## 2017-04-17 NOTE — Discharge Instructions (Signed)
Call the Cardiologist to be seen for evaluation.  Return if any problems.

## 2017-04-17 NOTE — ED Triage Notes (Signed)
PER EMS, pt c/o palpitations and dizziness that started after exercising. Hx of PAC's in the past but states he was feeling worse this time. Denies chest pain or SOB

## 2017-04-20 ENCOUNTER — Telehealth (HOSPITAL_COMMUNITY): Payer: Self-pay | Admitting: *Deleted

## 2017-04-20 NOTE — Telephone Encounter (Signed)
I cld pt to sched appt from referral from ED. Pt sched for 04/29/2017.  I asked pt if he had picked up and started his blood thinner.  Per patient he has not done this and has some reservations about taking it.  Pt was advised of the importance of the blood thinner for reducing the risk of blood clot that can be caused by afib.  Pt understood but did not want to start without discussing it with a provider.  Pt was advised if he goes back into afib he should seek medical care at ER if unable to get in anywhere else so that he is properly treated since he is not taking his medication.  Pt understood

## 2017-04-29 ENCOUNTER — Ambulatory Visit (HOSPITAL_COMMUNITY)
Admission: RE | Admit: 2017-04-29 | Discharge: 2017-04-29 | Disposition: A | Discharge status: Home / Self Care | Payer: BC Managed Care – PPO | Source: Ambulatory Visit | Attending: Nurse Practitioner | Admitting: Nurse Practitioner

## 2017-04-29 ENCOUNTER — Encounter (HOSPITAL_COMMUNITY): Payer: Self-pay | Admitting: Nurse Practitioner

## 2017-04-29 VITALS — BP 136/80 | HR 86 | Ht 74.0 in | Wt 220.0 lb

## 2017-04-29 DIAGNOSIS — R002 Palpitations: Secondary | ICD-10-CM | POA: Insufficient documentation

## 2017-04-29 DIAGNOSIS — I491 Atrial premature depolarization: Secondary | ICD-10-CM | POA: Diagnosis not present

## 2017-04-29 DIAGNOSIS — I4891 Unspecified atrial fibrillation: Secondary | ICD-10-CM | POA: Insufficient documentation

## 2017-04-29 DIAGNOSIS — I480 Paroxysmal atrial fibrillation: Secondary | ICD-10-CM

## 2017-04-29 MED ORDER — DILTIAZEM HCL 30 MG PO TABS: ORAL_TABLET | ORAL | 1 refills | Status: DC

## 2017-04-29 NOTE — Patient Instructions (Signed)
Your physician has recommended you make the following change in your medication:  1)Stop daily Cardizem 2)Cardizem 30mg  -- take 1 tablet every 4 hours AS NEEDED for AFIB heart rate >100    Scheduler will be in touch with you in regards to appointment with Dr. Caryl Comes.

## 2017-04-30 NOTE — Progress Notes (Signed)
Primary Care Physician: Doreene Nest, MD Referring Physician: [facility] ER f/u EP: Dr. Esther Hardy Larry Mathis is a 57 y.o. male with a h/o  Palpitations that was recently seen in the ER and found to be in afib, first time documented but pt has had episodes of palpitations for years. He converted with IV cardizem. Chadsvasc score is 0 but he was sent home with RX for Cardizem daily and eliquis which he has not started. States that the afib came on with weight lifting at the gym. He does not smoke or use alcohol, does not snore. Does exercise regularly and is not overweight. He has noted palpitations of and on but usually short lived. He wishes not to use meds.  Today, he denies symptoms of palpitations, chest pain, shortness of breath, orthopnea, PND, lower extremity edema, dizziness, presyncope, syncope, or neurologic sequela. The patient is tolerating medications without difficulties and is otherwise without complaint today.   Past Medical History:  Diagnosis Date  . Anxiety   . Chest pain   . Fatigue   . Lightheadedness   . PAC (premature atrial contraction)   . SOB (shortness of breath)    Past Surgical History:  Procedure Laterality Date  . BACK SURGERY    . HERNIA REPAIR      Current Outpatient Medications  Medication Sig Dispense Refill  . Omega-3 Fatty Acids (FISH OIL) 1200 MG CAPS Take by mouth.    . diltiazem (CARDIZEM) 30 MG tablet Take 1 tablet every 4 hours AS NEEDED for AFIB heart rate >100 45 tablet 1   No current facility-administered medications for this encounter.     No Known Allergies  Social History   Socioeconomic History  . Marital status: Married    Spouse name: Not on file  . Number of children: Not on file  . Years of education: Not on file  . Highest education level: Not on file  Social Needs  . Financial resource strain: Not on file  . Food insecurity - worry: Not on file  . Food insecurity - inability: Not on file  . Transportation  needs - medical: Not on file  . Transportation needs - non-medical: Not on file  Occupational History  . Not on file  Tobacco Use  . Smoking status: Former Research scientist (life sciences)  . Smokeless tobacco: Former Systems developer    Types: Snuff, Sarina Ser    Quit date: 08/19/1986  Substance and Sexual Activity  . Alcohol use: Yes  . Drug use: No  . Sexual activity: Not on file  Other Topics Concern  . Not on file  Social History Narrative  . Not on file    Family History  Problem Relation Age of Onset  . Cancer Mother        breast  . Cancer Father   . [place] White syndrome Father   . Heart disease Brother   . Heart murmur Brother     ROS- All systems are reviewed and negative except as per the HPI above  Physical Exam: Vitals:   04/29/17 1423  BP: 136/80  Pulse: 86  Weight: 220 lb (99.8 kg)  Height: 6\' 2"  (1.88 m)   Wt Readings from Last 3 Encounters:  04/29/17 220 lb (99.8 kg)  04/17/17 212 lb (96.2 kg)  09/19/16 215 lb 6.4 oz (97.7 kg)    Labs: Lab Results  Component Value Date   NA 136 04/17/2017   K 3.6 04/17/2017   CL 105 04/17/2017  CO2 21 (L) 04/17/2017   GLUCOSE 119 (H) 04/17/2017   BUN 15 04/17/2017   CREATININE 1.19 04/17/2017   CALCIUM 9.2 04/17/2017   No results found for: INR Lab Results  Component Value Date   CHOL 180 05/22/2009   HDL 34.40 (L) 05/22/2009   LDLCALC 121 (H) 05/22/2009   TRIG 123.0 05/22/2009     GEN- The patient is well appearing, alert and oriented x 3 today.   Head- normocephalic, atraumatic Eyes-  Sclera clear, conjunctiva pink Ears- hearing intact Oropharynx- clear Neck- supple, no JVP Lymph- no cervical lymphadenopathy Lungs- Clear to ausculation bilaterally, normal work of breathing Heart- Regular rate and rhythm, no murmurs, rubs or gallops, PMI not laterally displaced GI- soft, NT, ND, + BS Extremities- no clubbing, cyanosis, or edema MS- no significant deformity or atrophy Skin- no rash or lesion Psych- euthymic mood, full  affect Neuro- strength and sensation are intact  EKG-Sr at 86 bpm, pr int 126 ms, qrs int 90 ms, qtc 426 ms Epic records reviewed Echo 2011-Study Conclusions  - Left ventricle: The estimated ejection fraction was 60%. Wall  motion was normal; there were no regional wall motion  abnormalities. - Aortic valve: Mild regurgitation.   Assessment and Plan: 1. New onset afib Now in SR but with intermittent palpitations  General education re afib Will rx 30 mg SA Cardizem if needed for sustained irregular pulse He does not wish to take daily Cardizem at this time with pausity of symptoms  2. Chadsvasc score of 0 Does not need to use anticoagulation per guidelines  Pt will keep a log of frequency of palpitations and I will have him to f/u with Dr. Caryl Comes in 4-6 weeks  Larry Mathis, [facility] [address] [place], [place] [address] [phone]

## 2017-05-07 NOTE — Addendum Note (Signed)
Encounter addended by: Sherran Needs, NP on: 05/07/2017 10:28 AM  Actions taken: LOS modified

## 2017-06-04 ENCOUNTER — Ambulatory Visit: Payer: BC Managed Care – PPO | Admitting: Internal Medicine

## 2017-06-04 VITALS — BP 126/84 | HR 93 | Ht 72.0 in | Wt 218.0 lb

## 2017-06-04 DIAGNOSIS — I480 Paroxysmal atrial fibrillation: Secondary | ICD-10-CM | POA: Diagnosis not present

## 2017-06-04 NOTE — Progress Notes (Signed)
ELECTROPHYSIOLOGY OFFICE NOTE  Patient ID: Larry Mathis, MRN: [id number], DOB/AGE: 1961/01/04 57 y.o. Admit date: (Not on file) Date of Consult: 06/04/2017  Primary Physician: Doreene Nest, MD Primary Cardiologist: [facility]     Larry Mathis is a 57 y.o. male who is being seen today for the evaluation of AFib at the request of AFib clinic.    HPI Larry Mathis is a 57 y.o. male with a recent diagnosis of atrial fibrillation.   The event was  triggered by working out at [employer].  It was his impression that it started and stopped a couple of times each episode lasting tends of minutes.  It was associated with lightheadedness.  He was seen in the emergency room and anticoagulation and daily Cardizem were prescribed.  He was seen in the A. fib clinic; appropriately with his CHADS-VASc score of 0 anticoagulation was discontinued and the Cardizem was made PRN.  Unfortunately he has continued to have episodes, having had 2 in the last few weeks.  They typically last 1-2 hours and are associated with lightheadedness and presyncope.  There is some shortness of breath.  Apart from this he has no exercise intolerance.  There is no edema.  He has had no neurological symptoms.  No chest pain.  He denies sleep disordered breathing; this was a problem prior to his interval 20 pound weight loss.  He uses alcohol infrequently (less than 6 beers per week)  Chads vas score 0   Past Medical History:  Diagnosis Date  . Anxiety   . Chest pain   . Fatigue   . Lightheadedness   . PAC (premature atrial contraction)   . SOB (shortness of breath)       Surgical History:  Past Surgical History:  Procedure Laterality Date  . BACK SURGERY    . HERNIA REPAIR       Home Meds: Prior to Admission medications   Medication Sig Start Date End Date Taking? Authorizing Provider  diltiazem (CARDIZEM) 30 MG tablet Take 1 tablet every 4 hours AS NEEDED for AFIB heart rate >100 04/29/17   Sherran Needs, NP  Omega-3 Fatty Acids (FISH OIL) 1200 MG CAPS Take by mouth.    [provider]    Allergies: No Known Allergies  Social History   Socioeconomic History  . Marital status: Married    Spouse name: Not on file  . Number of children: Not on file  . Years of education: Not on file  . Highest education level: Not on file  Social Needs  . Financial resource strain: Not on file  . Food insecurity - worry: Not on file  . Food insecurity - inability: Not on file  . Transportation needs - medical: Not on file  . Transportation needs - non-medical: Not on file  Occupational History  . Not on file  Tobacco Use  . Smoking status: Former Research scientist (life sciences)  . Smokeless tobacco: Former Systems developer    Types: Snuff, Sarina Ser    Quit date: 08/19/1986  Substance and Sexual Activity  . Alcohol use: Yes  . Drug use: No  . Sexual activity: Not on file  Other Topics Concern  . Not on file  Social History Narrative  . Not on file     Family History  Problem Relation Age of Onset  . Cancer Mother        breast  . Cancer Father   . [place] White syndrome Father   .  Heart disease Brother   . Heart murmur Brother      ROS:  Please see the history of present illness.     All other systems reviewed and negative.    Physical Exam: Blood pressure 126/84, pulse 93, height 6' (1.829 m), weight 218 lb (98.9 kg). General: Well developed, well nourished male in no acute distress. Head: Normocephalic, atraumatic, sclera non-icteric, no xanthomas, nares are without discharge. EENT: normal  Lymph Nodes:  none Neck: Negative for carotid bruits. JVD not elevated. Back:without scoliosis kyphosis Lungs: Clear bilaterally to auscultation without wheezes, rales, or rhonchi. Breathing is unlabored. Heart: RRR with S1 S2. No  systolic murmur . No rubs, or gallops appreciated. Abdomen: Soft, non-tender, non-distended with normoactive bowel sounds. No hepatomegaly. No rebound/guarding. No obvious  abdominal masses. Msk:  Strength and tone appear normal for age. Extremities: No clubbing or cyanosis edema.  Distal pedal pulses are 2+ and equal bilaterally. Skin: Warm and Dry Neuro: Alert and oriented X 3. CN III-XII intact Grossly normal sensory and motor function . Psych:  Responds to questions appropriately with a normal affect.      Labs: Cardiac Enzymes No results for input(s): CKTOTAL, CKMB, TROPONINI in the last 72 hours. CBC Lab Results  Component Value Date   WBC 6.2 04/17/2017   HGB 15.9 04/17/2017   HCT 45.7 04/17/2017   MCV 89.8 04/17/2017   PLT 212 04/17/2017   PROTIME: No results for input(s): LABPROT, INR in the last 72 hours. Chemistry No results for input(s): NA, K, CL, CO2, BUN, CREATININE, CALCIUM, PROT, BILITOT, ALKPHOS, ALT, AST, GLUCOSE in the last 168 hours.  Invalid input(s): LABALBU Lipids Lab Results  Component Value Date   CHOL 180 05/22/2009   HDL 34.40 (L) 05/22/2009   LDLCALC 121 (H) 05/22/2009   TRIG 123.0 05/22/2009   BNP No results found for: PROBNP Thyroid Function Tests: No results for input(s): TSH, T4TOTAL, T3FREE, THYROIDAB in the last 72 hours.  Invalid input(s): FREET3 Miscellaneous No results found for: DDIMER  Radiology/Studies:  No results found.  EKG: ECG demonstrates sinus rhythm at 93 Intervals 13/09/35   Assessment and Plan:  Atrial fibrillation-paroxysmal  PACs  Elevated blood pressure   The patient has paroxysms of atrial fibrillation which are associated with quite problematic symptoms with lightheadedness.  We discussed treatment strategies, initially focusing on reactive therapy versus preemptive therapy.  In the latter group, we discussed both antiarrhythmic drugs and catheter ablation.  He is not graded taking medicines he says and would much rather think about a catheter ablation procedure as primary therapy for his symptomatic recurrent atrial fibrillation.  In anticipation of this we will get a  TSH as well as an echocardiogram to look at left ventricular function and left atrial size.  He is aware that he would need anticoagulation prior to the procedure but will hold until he meets with Dr. Ardyth Man

## 2017-06-04 NOTE — Patient Instructions (Signed)
Medication Instructions:  Your physician recommends that you continue on your current medications as directed. Please refer to the Current Medication list given to you today.  Labwork: None ordered.  Testing/Procedures:  Your physician has requested that you have an echocardiogram. Echocardiography is a painless test that uses sound waves to create images of your heart. It provides your doctor with information about the size and shape of your heart and how well your heart's chambers and valves are working. This procedure takes approximately one hour. There are no restrictions for this procedure.    Follow-Up: Your physician recommends that you schedule a follow-up appointment with Dr Rayann Heman for an Afib ablation consult.    Any Other Special Instructions Will Be Listed Below (If Applicable).     If you need a refill on your cardiac medications before your next appointment, please call your pharmacy.

## 2017-06-11 ENCOUNTER — Other Ambulatory Visit: Payer: BC Managed Care – PPO | Admitting: *Deleted

## 2017-06-11 ENCOUNTER — Other Ambulatory Visit: Payer: Self-pay

## 2017-06-11 ENCOUNTER — Ambulatory Visit (HOSPITAL_COMMUNITY): Payer: BC Managed Care – PPO | Attending: Internal Medicine

## 2017-06-11 DIAGNOSIS — I480 Paroxysmal atrial fibrillation: Secondary | ICD-10-CM

## 2017-06-11 DIAGNOSIS — I517 Cardiomegaly: Secondary | ICD-10-CM | POA: Diagnosis not present

## 2017-06-11 DIAGNOSIS — Z8249 Family history of ischemic heart disease and other diseases of the circulatory system: Secondary | ICD-10-CM | POA: Diagnosis not present

## 2017-06-11 DIAGNOSIS — I340 Nonrheumatic mitral (valve) insufficiency: Secondary | ICD-10-CM | POA: Diagnosis not present

## 2017-06-11 DIAGNOSIS — I491 Atrial premature depolarization: Secondary | ICD-10-CM | POA: Diagnosis not present

## 2017-06-11 DIAGNOSIS — Z87891 Personal history of nicotine dependence: Secondary | ICD-10-CM | POA: Diagnosis not present

## 2017-06-11 DIAGNOSIS — R42 Dizziness and giddiness: Secondary | ICD-10-CM | POA: Insufficient documentation

## 2017-06-11 LAB — TSH: TSH: 2.91 u[IU]/mL (ref 0.450–4.500)

## 2017-06-29 ENCOUNTER — Ambulatory Visit: Payer: BC Managed Care – PPO | Admitting: Internal Medicine

## 2017-06-29 ENCOUNTER — Encounter: Payer: Self-pay | Admitting: Internal Medicine

## 2017-06-29 VITALS — BP 122/76 | HR 82 | Ht 72.0 in | Wt 219.0 lb

## 2017-06-29 DIAGNOSIS — I491 Atrial premature depolarization: Secondary | ICD-10-CM

## 2017-06-29 DIAGNOSIS — I483 Typical atrial flutter: Secondary | ICD-10-CM

## 2017-06-29 DIAGNOSIS — R002 Palpitations: Secondary | ICD-10-CM | POA: Diagnosis not present

## 2017-06-29 DIAGNOSIS — I480 Paroxysmal atrial fibrillation: Secondary | ICD-10-CM

## 2017-06-29 MED ORDER — RIVAROXABAN 20 MG PO TABS: 20.0000 mg | ORAL_TABLET | Freq: Every day | ORAL | 11 refills | Status: DC

## 2017-06-29 MED ORDER — METOPROLOL TARTRATE 50 MG PO TABS: 50.0000 mg | ORAL_TABLET | Freq: Once | ORAL | 0 refills | Status: DC

## 2017-06-29 NOTE — Progress Notes (Signed)
   PCP: Doreene Nest, MD   Primary EP: Dr Esther Hardy Larry Mathis is a 57 y.o. male who presents today for routine electrophysiology followup.  He was diagnosed with atrial fibrillation and atrial flutter in December.  He has been followed by Dr Francena Hanly for quite some time due to palpitations.  Previously had PACs.  He finds that episodes are more frequent during exercise.  He has palpitations and fatigue.  Also has dizziness during tachycardia. He finds episodes to be anxiety producing.  He has occasional bradycardia and does not feel that he can tolerate AADs due to dizziness. Today, he denies symptoms of chest pain, shortness of breath,  lower extremity edema, presyncope, or syncope.  The patient is otherwise without complaint today.   Past Medical History:  Diagnosis Date  . Anxiety   . Chest pain   . Fatigue   . Lightheadedness   . PAC (premature atrial contraction)   . SOB (shortness of breath)    Past Surgical History:  Procedure Laterality Date  . BACK SURGERY    . HERNIA REPAIR      ROS- all systems are reviewed and negatives except as per HPI above  Current Outpatient Medications  Medication Sig Dispense Refill  . diltiazem (CARDIZEM) 30 MG tablet Take 1 tablet every 4 hours AS NEEDED for AFIB heart rate >100 45 tablet 1  . Omega-3 Fatty Acids (FISH OIL) 1200 MG CAPS Take by mouth.     No current facility-administered medications for this visit.     Physical Exam: Vitals:   06/29/17 0904  BP: 122/76  Pulse: 82  Weight: 219 lb (99.3 kg)  Height: 6' (1.829 m)    GEN- The patient is well appearing, alert and oriented x 3 today.   Head- normocephalic, atraumatic Eyes-  Sclera clear, conjunctiva pink Ears- hearing intact Oropharynx- clear Lungs- Clear to ausculation bilaterally, normal work of breathing Heart- Regular rate and rhythm, no murmurs, rubs or gallops, PMI not laterally displaced GI- soft, NT, ND, + BS Extremities- no clubbing, cyanosis, or  edema  EKG tracing ordered today is personally reviewed and shows sinus rhythnm 82 bpm, PR 126 msec, inferior infarct,  ekg reviewed with Dr Caryl Comes,  We agree that he does not have obvious pre-excitation  Assessment and Plan:  1. Afib/ atrial flutter (typical by EKG 04/17/17) The patient has symptomatic atrial arrhythmias.  He has failed medical therapy with toprol.  Does not feel that he can tolerate AADs at this time. Therapeutic strategies for afib/ atrial flutter including medicine and ablation were discussed in detail with the patient today. Risk, benefits, and alternatives to EP study and radiofrequency ablation were also discussed in detail today.  Given EKG, will need to look closely for APs.  These risks include but are not limited to stroke, bleeding, vascular damage, tamponade, perforation, damage to the esophagus, lungs, and other structures, pulmonary vein stenosis, worsening renal function, and death. The patient understands these risk and wishes to proceed.  We will therefore proceed with catheter ablation once the patient has been adequately anticoagulated.  Start xarelto 20mg  daily today.  chads2vasc score is 0.  Cardiac CT prior to ablation. Carto, ICE, anesthesia requested for the procedure.   Thompson Grayer MD, [facility] 06/29/2017 9:21 AM

## 2017-06-29 NOTE — H&P (View-Only) (Signed)
   PCP: Doreene Nest, MD   Primary EP: Dr Esther Hardy YAROSLAV GOMBOS is a 57 y.o. male who presents today for routine electrophysiology followup.  He was diagnosed with atrial fibrillation and atrial flutter in December.  He has been followed by Dr Francena Hanly for quite some time due to palpitations.  Previously had PACs.  He finds that episodes are more frequent during exercise.  He has palpitations and fatigue.  Also has dizziness during tachycardia. He finds episodes to be anxiety producing.  He has occasional bradycardia and does not feel that he can tolerate AADs due to dizziness. Today, he denies symptoms of chest pain, shortness of breath,  lower extremity edema, presyncope, or syncope.  The patient is otherwise without complaint today.   Past Medical History:  Diagnosis Date  . Anxiety   . Chest pain   . Fatigue   . Lightheadedness   . PAC (premature atrial contraction)   . SOB (shortness of breath)    Past Surgical History:  Procedure Laterality Date  . BACK SURGERY    . HERNIA REPAIR      ROS- all systems are reviewed and negatives except as per HPI above  Current Outpatient Medications  Medication Sig Dispense Refill  . diltiazem (CARDIZEM) 30 MG tablet Take 1 tablet every 4 hours AS NEEDED for AFIB heart rate >100 45 tablet 1  . Omega-3 Fatty Acids (FISH OIL) 1200 MG CAPS Take by mouth.     No current facility-administered medications for this visit.     Physical Exam: Vitals:   06/29/17 0904  BP: 122/76  Pulse: 82  Weight: 219 lb (99.3 kg)  Height: 6' (1.829 m)    GEN- The patient is well appearing, alert and oriented x 3 today.   Head- normocephalic, atraumatic Eyes-  Sclera clear, conjunctiva pink Ears- hearing intact Oropharynx- clear Lungs- Clear to ausculation bilaterally, normal work of breathing Heart- Regular rate and rhythm, no murmurs, rubs or gallops, PMI not laterally displaced GI- soft, NT, ND, + BS Extremities- no clubbing, cyanosis, or  edema  EKG tracing ordered today is personally reviewed and shows sinus rhythnm 82 bpm, PR 126 msec, inferior infarct,  ekg reviewed with Dr Caryl Comes,  We agree that he does not have obvious pre-excitation  Assessment and Plan:  1. Afib/ atrial flutter (typical by EKG 04/17/17) The patient has symptomatic atrial arrhythmias.  He has failed medical therapy with toprol.  Does not feel that he can tolerate AADs at this time. Therapeutic strategies for afib/ atrial flutter including medicine and ablation were discussed in detail with the patient today. Risk, benefits, and alternatives to EP study and radiofrequency ablation were also discussed in detail today.  Given EKG, will need to look closely for APs.  These risks include but are not limited to stroke, bleeding, vascular damage, tamponade, perforation, damage to the esophagus, lungs, and other structures, pulmonary vein stenosis, worsening renal function, and death. The patient understands these risk and wishes to proceed.  We will therefore proceed with catheter ablation once the patient has been adequately anticoagulated.  Start xarelto 20mg  daily today.  chads2vasc score is 0.  Cardiac CT prior to ablation. Carto, ICE, anesthesia requested for the procedure.   Thompson Grayer MD, [facility] 06/29/2017 9:21 AM

## 2017-06-29 NOTE — Patient Instructions (Addendum)
Medication Instructions:  Your physician recommends that you continue on your current medications as directed. Please refer to the Current Medication list given to you today. If you need a refill on your cardiac medications before your next appointment, please call your pharmacy.  Labwork: You will get lab work within 14 days of your procedure:  BMP and CBC. Please schedule.  Testing/Procedures:  Your physician has requested that you have cardiac CT. Cardiac computed tomography (CT) is a painless test that uses an x-ray machine to take clear, detailed pictures of your heart. For further information please visit [facility]. Please follow instruction sheet as given.-You will get a call from our office to schedule the date for this test.  Your physician has recommended that you have an ablation. Catheter ablation is a medical procedure used to treat some cardiac arrhythmias (irregular heartbeats). During catheter ablation, a long, thin, flexible tube is put into a blood vessel in your groin (upper thigh), or neck. This tube is called an ablation catheter. It is then guided to your heart through the blood vessel. Radio frequency waves destroy small areas of heart tissue where abnormal heartbeats may cause an arrhythmia to start. Please see the instruction sheet given to you today.  Follow-Up: You will follow up with Roderic Palau, NP with the afib clinic 4 weeks after your ablation.  You will follow up with Dr. Rayann Heman 3 months after your procedure.  Any Other Special Instructions Will Be Listed Below (If Applicable).  Ablation directions: Please arrive at the [facility] main entrance of [place] [facility] at: 5:30 am on July 24, 2017. Do not eat or drink after midnight prior to procedure. On the morning of your procedure do not take any medications. Plan for one night stay.  You will need someone to drive you home at discharge.   CARDIAC CT INSTRUCTIONS:  Please arrive at the  [facility] main entrance of [facility] [place], [place] [address] [phone]  Proceed to the [facility] Radiology Department (First Floor).  Please follow these instructions carefully (unless otherwise directed):  Hold all erectile dysfunction medications at least 48 hours prior to test.  On the Night Before the Test: . Drink plenty of water. . Do not consume any caffeinated/decaffeinated beverages or chocolate 12 hours prior to your test. . Do not take any antihistamines 12 hours prior to your test. . If you take Metformin do not take 24 hours prior to test. On the Day of the Test: . Drink plenty of water. Do not drink any water within one hour of the test. . Do not eat any food 4 hours prior to the test. . You may take your regular medications prior to the test. . IF NOT ON A BETA BLOCKER - Take 50 mg of lopressor (metoprolol) one hour before the test.  You are on a beta blocker.  Take your nebivolol prior to this procedure. Marland Kitchen HOLD Furosemide morning of the test.  After the Test: . Drink plenty of water. . After receiving IV contrast, you may experience a mild flushed feeling. This is normal. . On occasion, you may experience a mild rash up to 24 hours after the test. This is not dangerous. If this occurs, you can take Benadryl 25 mg and increase your fluid intake. . If you experience trouble breathing, this can be serious. If it is severe call 911 IMMEDIATELY. If it is mild, please call our office. . If you  take any of these medications: Glipizide/Metformin, Avandament, Glucavance, please do not take 48 hours after completing test.

## 2017-06-30 ENCOUNTER — Other Ambulatory Visit: Payer: Self-pay

## 2017-06-30 DIAGNOSIS — I4819 Other persistent atrial fibrillation: Secondary | ICD-10-CM

## 2017-07-17 ENCOUNTER — Encounter (INDEPENDENT_AMBULATORY_CARE_PROVIDER_SITE_OTHER): Payer: Self-pay

## 2017-07-17 ENCOUNTER — Other Ambulatory Visit: Payer: BC Managed Care – PPO

## 2017-07-17 DIAGNOSIS — I4819 Other persistent atrial fibrillation: Secondary | ICD-10-CM

## 2017-07-17 LAB — CBC WITH DIFFERENTIAL/PLATELET
BASOS ABS: 0 10*3/uL (ref 0.0–0.2)
Basos: 0 %
EOS (ABSOLUTE): 0.1 10*3/uL (ref 0.0–0.4)
Eos: 3 %
HEMOGLOBIN: 16.1 g/dL (ref 13.0–17.7)
Hematocrit: 45 % (ref 37.5–51.0)
IMMATURE GRANS (ABS): 0 10*3/uL (ref 0.0–0.1)
IMMATURE GRANULOCYTES: 0 %
LYMPHS: 28 %
Lymphocytes Absolute: 1.5 10*3/uL (ref 0.7–3.1)
MCH: 32.1 pg (ref 26.6–33.0)
MCHC: 35.8 g/dL — ABNORMAL HIGH (ref 31.5–35.7)
MCV: 90 fL (ref 79–97)
MONOCYTES: 11 %
Monocytes Absolute: 0.6 10*3/uL (ref 0.1–0.9)
NEUTROS ABS: 3.2 10*3/uL (ref 1.4–7.0)
NEUTROS PCT: 58 %
PLATELETS: 214 10*3/uL (ref 150–379)
RBC: 5.01 x10E6/uL (ref 4.14–5.80)
RDW: 12.6 % (ref 12.3–15.4)
WBC: 5.5 10*3/uL (ref 3.4–10.8)

## 2017-07-17 LAB — BASIC METABOLIC PANEL
BUN/Creatinine Ratio: 17 (ref 9–20)
BUN: 22 mg/dL (ref 6–24)
CALCIUM: 9.1 mg/dL (ref 8.7–10.2)
CHLORIDE: 101 mmol/L (ref 96–106)
CO2: 26 mmol/L (ref 20–29)
Creatinine, Ser: 1.27 mg/dL (ref 0.76–1.27)
GFR, EST AFRICAN AMERICAN: 73 mL/min/{1.73_m2} (ref >59)
GFR, EST NON AFRICAN AMERICAN: 63 mL/min/{1.73_m2} (ref >59)
Glucose: 102 mg/dL — ABNORMAL HIGH (ref 65–99)
POTASSIUM: 4.4 mmol/L (ref 3.5–5.2)
Sodium: 140 mmol/L (ref 134–144)

## 2017-07-20 ENCOUNTER — Ambulatory Visit (HOSPITAL_COMMUNITY): Payer: BC Managed Care – PPO

## 2017-07-20 ENCOUNTER — Ambulatory Visit (HOSPITAL_COMMUNITY)
Admission: RE | Admit: 2017-07-20 | Discharge: 2017-07-20 | Disposition: A | Discharge status: Home / Self Care | Payer: BC Managed Care – PPO | Source: Ambulatory Visit | Attending: Internal Medicine | Admitting: Internal Medicine

## 2017-07-20 ENCOUNTER — Encounter (HOSPITAL_COMMUNITY): Payer: Self-pay

## 2017-07-20 DIAGNOSIS — I480 Paroxysmal atrial fibrillation: Secondary | ICD-10-CM

## 2017-07-20 DIAGNOSIS — I4891 Unspecified atrial fibrillation: Secondary | ICD-10-CM | POA: Diagnosis not present

## 2017-07-20 MED ORDER — IOPAMIDOL (ISOVUE-370) INJECTION 76%
INTRAVENOUS | Status: AC
  Administered 2017-07-20: 80 mL
  Filled 2017-07-20: qty 100

## 2017-07-20 MED ORDER — METOPROLOL TARTRATE 5 MG/5ML IV SOLN
5.0000 mg | Freq: Once | INTRAVENOUS | Status: AC
  Administered 2017-07-20 (×2): 5 mg via INTRAVENOUS
  Filled 2017-07-20: qty 5

## 2017-07-20 MED ORDER — METOPROLOL TARTRATE 5 MG/5ML IV SOLN
INTRAVENOUS | Status: AC
  Administered 2017-07-20: 5 mg via INTRAVENOUS
  Filled 2017-07-20: qty 5

## 2017-07-24 ENCOUNTER — Ambulatory Visit (HOSPITAL_COMMUNITY): Payer: BC Managed Care – PPO | Admitting: Certified Registered Nurse Anesthetist

## 2017-07-24 ENCOUNTER — Ambulatory Visit (HOSPITAL_COMMUNITY)
Admission: RE | Admit: 2017-07-24 | Discharge: 2017-07-24 | Disposition: A | Discharge status: Home / Self Care | Payer: BC Managed Care – PPO | Source: Ambulatory Visit | Attending: Internal Medicine | Admitting: Internal Medicine

## 2017-07-24 ENCOUNTER — Encounter (HOSPITAL_COMMUNITY)
Admission: RE | Disposition: A | Discharge status: Home / Self Care | Payer: Self-pay | Source: Ambulatory Visit | Attending: Internal Medicine

## 2017-07-24 ENCOUNTER — Encounter (HOSPITAL_COMMUNITY): Payer: Self-pay | Admitting: Certified Registered"

## 2017-07-24 DIAGNOSIS — I483 Typical atrial flutter: Secondary | ICD-10-CM | POA: Diagnosis not present

## 2017-07-24 DIAGNOSIS — I480 Paroxysmal atrial fibrillation: Secondary | ICD-10-CM | POA: Diagnosis not present

## 2017-07-24 DIAGNOSIS — Z79899 Other long term (current) drug therapy: Secondary | ICD-10-CM | POA: Diagnosis not present

## 2017-07-24 DIAGNOSIS — Z7901 Long term (current) use of anticoagulants: Secondary | ICD-10-CM | POA: Insufficient documentation

## 2017-07-24 DIAGNOSIS — Z87891 Personal history of nicotine dependence: Secondary | ICD-10-CM | POA: Insufficient documentation

## 2017-07-24 HISTORY — PX: ATRIAL FIBRILLATION ABLATION: EP1191

## 2017-07-24 LAB — POCT ACTIVATED CLOTTING TIME
ACTIVATED CLOTTING TIME: 191 s
Activated Clotting Time: 268 seconds
Activated Clotting Time: 285 seconds
Activated Clotting Time: 318 seconds
Activated Clotting Time: 357 seconds
Activated Clotting Time: 186 seconds

## 2017-07-24 SURGERY — ATRIAL FIBRILLATION ABLATION
Anesthesia: General

## 2017-07-24 MED ORDER — ONDANSETRON HCL 4 MG/2ML IJ SOLN: 4.0000 mg | Freq: Four times a day (QID) | INTRAMUSCULAR | Status: DC | PRN

## 2017-07-24 MED ORDER — SODIUM CHLORIDE 0.9 % IV SOLN
INTRAVENOUS | Status: DC
Start: 2017-07-24 — End: 2017-07-24
  Administered 2017-07-24: 08:00:00 via INTRAVENOUS

## 2017-07-24 MED ORDER — HEPARIN SODIUM (PORCINE) 1000 UNIT/ML IJ SOLN
INTRAMUSCULAR | Status: AC
  Filled 2017-07-24: qty 1

## 2017-07-24 MED ORDER — ACETAMINOPHEN 325 MG PO TABS: 650.0000 mg | ORAL_TABLET | ORAL | Status: DC | PRN

## 2017-07-24 MED ORDER — LIDOCAINE HCL (CARDIAC) 20 MG/ML IV SOLN
INTRAVENOUS | Status: DC | PRN
  Administered 2017-07-24: 100 mg via INTRAVENOUS

## 2017-07-24 MED ORDER — ISOPROTERENOL HCL 0.2 MG/ML IJ SOLN
INTRAMUSCULAR | Status: AC
  Filled 2017-07-24: qty 5

## 2017-07-24 MED ORDER — OXYCODONE HCL 5 MG PO TABS: 5.0000 mg | ORAL_TABLET | Freq: Once | ORAL | Status: DC | PRN

## 2017-07-24 MED ORDER — HEPARIN SODIUM (PORCINE) 1000 UNIT/ML IJ SOLN
INTRAMUSCULAR | Status: DC | PRN
  Administered 2017-07-24: 12000 [IU] via INTRAVENOUS
  Administered 2017-07-24: 1000 [IU] via INTRAVENOUS

## 2017-07-24 MED ORDER — OXYCODONE HCL 5 MG/5ML PO SOLN: 5.0000 mg | Freq: Once | ORAL | Status: DC | PRN

## 2017-07-24 MED ORDER — FENTANYL CITRATE (PF) 100 MCG/2ML IJ SOLN: 25.0000 ug | INTRAMUSCULAR | Status: DC | PRN

## 2017-07-24 MED ORDER — IOPAMIDOL (ISOVUE-370) INJECTION 76%
INTRAVENOUS | Status: AC
  Filled 2017-07-24: qty 50

## 2017-07-24 MED ORDER — ROCURONIUM BROMIDE 100 MG/10ML IV SOLN
INTRAVENOUS | Status: DC | PRN
  Administered 2017-07-24: 50 mg via INTRAVENOUS

## 2017-07-24 MED ORDER — HEPARIN (PORCINE) IN NACL 2-0.9 UNIT/ML-% IJ SOLN
INTRAMUSCULAR | Status: AC | PRN
  Administered 2017-07-24: 500 mL

## 2017-07-24 MED ORDER — BUPIVACAINE HCL (PF) 0.25 % IJ SOLN
INTRAMUSCULAR | Status: AC
  Filled 2017-07-24: qty 30

## 2017-07-24 MED ORDER — SODIUM CHLORIDE 0.9 % IV SOLN: 250.0000 mL | INTRAVENOUS | Status: DC | PRN

## 2017-07-24 MED ORDER — PANTOPRAZOLE SODIUM 40 MG PO TBEC: 40.0000 mg | DELAYED_RELEASE_TABLET | Freq: Every day | ORAL | 0 refills | Status: DC

## 2017-07-24 MED ORDER — ISOPROTERENOL HCL 0.2 MG/ML IJ SOLN
INTRAMUSCULAR | Status: DC | PRN
  Administered 2017-07-24: 20 ug/min via INTRAVENOUS

## 2017-07-24 MED ORDER — BUPIVACAINE HCL (PF) 0.25 % IJ SOLN
INTRAMUSCULAR | Status: DC | PRN
  Administered 2017-07-24: 30 mL

## 2017-07-24 MED ORDER — SODIUM CHLORIDE 0.9% FLUSH: 3.0000 mL | Freq: Two times a day (BID) | INTRAVENOUS | Status: DC

## 2017-07-24 MED ORDER — HEPARIN SODIUM (PORCINE) 1000 UNIT/ML IJ SOLN
INTRAMUSCULAR | Status: DC | PRN
  Administered 2017-07-24: 4000 [IU] via INTRAVENOUS
  Administered 2017-07-24: 1000 [IU] via INTRAVENOUS
  Administered 2017-07-24: 4000 [IU] via INTRAVENOUS
  Administered 2017-07-24: 3000 [IU] via INTRAVENOUS

## 2017-07-24 MED ORDER — SODIUM CHLORIDE 0.9% FLUSH: 3.0000 mL | INTRAVENOUS | Status: DC | PRN

## 2017-07-24 MED ORDER — IOPAMIDOL (ISOVUE-370) INJECTION 76%
INTRAVENOUS | Status: DC | PRN
  Administered 2017-07-24: 3 mL via INTRAVENOUS

## 2017-07-24 MED ORDER — HYDROCODONE-ACETAMINOPHEN 5-325 MG PO TABS: 1.0000 | ORAL_TABLET | ORAL | Status: DC | PRN

## 2017-07-24 MED ORDER — SUGAMMADEX SODIUM 200 MG/2ML IV SOLN
INTRAVENOUS | Status: DC | PRN
  Administered 2017-07-24: 200 mg via INTRAVENOUS

## 2017-07-24 MED ORDER — PHENYLEPHRINE HCL 10 MG/ML IJ SOLN
INTRAVENOUS | Status: DC | PRN
  Administered 2017-07-24: 30 ug/min via INTRAVENOUS

## 2017-07-24 MED ORDER — FENTANYL CITRATE (PF) 100 MCG/2ML IJ SOLN
INTRAMUSCULAR | Status: DC | PRN
  Administered 2017-07-24: 100 ug via INTRAVENOUS

## 2017-07-24 MED ORDER — MIDAZOLAM HCL 5 MG/5ML IJ SOLN
INTRAMUSCULAR | Status: DC | PRN
  Administered 2017-07-24: 2 mg via INTRAVENOUS

## 2017-07-24 MED ORDER — ONDANSETRON HCL 4 MG/2ML IJ SOLN
INTRAMUSCULAR | Status: DC | PRN
  Administered 2017-07-24: 4 mg via INTRAVENOUS

## 2017-07-24 MED ORDER — PROTAMINE SULFATE 10 MG/ML IV SOLN
INTRAVENOUS | Status: DC | PRN
  Administered 2017-07-24: 30 mg via INTRAVENOUS

## 2017-07-24 MED ORDER — HEPARIN SODIUM (PORCINE) 1000 UNIT/ML IJ SOLN
INTRAMUSCULAR | Status: DC | PRN
  Administered 2017-07-24: 1000 [IU] via INTRAVENOUS

## 2017-07-24 MED ORDER — PHENYLEPHRINE HCL 10 MG/ML IJ SOLN
INTRAMUSCULAR | Status: DC | PRN
  Administered 2017-07-24: 120 ug via INTRAVENOUS

## 2017-07-24 MED ORDER — HEPARIN (PORCINE) IN NACL 2-0.9 UNIT/ML-% IJ SOLN
INTRAMUSCULAR | Status: AC
  Filled 2017-07-24: qty 500

## 2017-07-24 MED ORDER — PROPOFOL 10 MG/ML IV BOLUS
INTRAVENOUS | Status: DC | PRN
  Administered 2017-07-24: 200 mg via INTRAVENOUS

## 2017-07-24 SURGICAL SUPPLY — 18 items
BLANKET WARM UNDERBOD FULL ACC (MISCELLANEOUS) ×3 IMPLANT
CATH MAPPNG PENTARAY F 2-6-2MM (CATHETERS) IMPLANT
CATH NAVISTAR SMARTTOUCH DF (ABLATOR) ×2 IMPLANT
CATH SOUNDSTAR 3D IMAGING (CATHETERS) ×2 IMPLANT
CATH WEBSTER BI DIR CS D-F CRV (CATHETERS) ×2 IMPLANT
COVER SWIFTLINK CONNECTOR (BAG) ×3 IMPLANT
NDL TRANSEP BRK 71CM 407200 (NEEDLE) IMPLANT
NEEDLE TRANSEP BRK 71CM 407200 (NEEDLE) ×3 IMPLANT
PACK EP LATEX FREE (CUSTOM PROCEDURE TRAY) ×3
PACK EP LF (CUSTOM PROCEDURE TRAY) ×1 IMPLANT
PAD DEFIB LIFELINK (PAD) ×3 IMPLANT
PATCH CARTO3 (PAD) ×2 IMPLANT
PENTARAY F 2-6-2MM (CATHETERS) ×3
SHEATH AVANTI 11CM 7FR (SHEATH) ×4 IMPLANT
SHEATH AVANTI 11CM 9FR (SHEATH) ×2 IMPLANT
SHEATH AVANTI 11F 11CM (SHEATH) ×2 IMPLANT
SHEATH SWARTZ TS SL2 63CM 8.5F (SHEATH) ×3 IMPLANT
TUBING SMART ABLATE COOLFLOW (TUBING) ×2 IMPLANT

## 2017-07-24 NOTE — Discharge Instructions (Signed)
Monitored Anesthesia Care, Care After These instructions provide you with information about caring for yourself after your procedure. Your health care provider may also give you more specific instructions. Your treatment has been planned according to current medical practices, but problems sometimes occur. Call your health care provider if you have any problems or questions after your procedure. What can I expect after the procedure? After your procedure, it is common to:  Feel sleepy for several hours.  Feel clumsy and have poor balance for several hours.  Feel forgetful about what happened after the procedure.  Have poor judgment for several hours.  Feel nauseous or vomit.  Have a sore throat if you had a breathing tube during the procedure.  Follow these instructions at home: For at least 24 hours after the procedure:   Do not: ? Participate in activities in which you could fall or become injured. ? Drive. ? Use heavy machinery. ? Drink alcohol. ? Take sleeping pills or medicines that cause drowsiness. ? Make important decisions or sign legal documents. ? Take care of children on your own.  Rest. Eating and drinking  Follow the diet that is recommended by your health care provider.  If you vomit, drink water, juice, or soup when you can drink without vomiting.  Make sure you have little or no nausea before eating solid foods. General instructions  Have a responsible adult stay with you until you are awake and alert.  Take over-the-counter and prescription medicines only as told by your health care provider.  If you smoke, do not smoke without supervision.  Keep all follow-up visits as told by your health care provider. This is important. Contact a health care provider if:  You keep feeling nauseous or you keep vomiting.  You feel light-headed.  You develop a rash.  You have a fever. Get help right away if:  You have trouble breathing. This information  is not intended to replace advice given to you by your health care provider. Make sure you discuss any questions you have with your health care provider. Document Released: 08/05/2015 Document Revised: 12/05/2015 Document Reviewed: 08/05/2015 Elsevier Interactive Patient Education  2018 [place] Refer to this sheet in the next few weeks. These instructions provide you with information about caring for yourself after your procedure. Your health care provider may also give you more specific instructions. Your treatment has been planned according to current medical practices, but problems sometimes occur. Call your health care provider if you have any problems or questions after your procedure. What can I expect after the procedure? After your procedure, it is typical to have the following:  Bruising at the site that usually fades within 1-2 weeks.  Blood collecting in the tissue (hematoma) that may be painful to the touch. It should usually decrease in size and tenderness within 1-2 weeks.  Follow these instructions at home:  Take medicines only as directed by your health care provider.  You may shower 24-48 hours after the procedure or as directed by your health care provider. Remove the bandage (dressing) and gently wash the site with plain soap and water. Pat the area dry with a clean towel. Do not rub the site, because this may cause bleeding.  Do not take baths, swim, or use a hot tub until your health care provider approves.  Check your insertion site every day for redness, swelling, or drainage.  Do not apply powder or lotion to the site.  Limit use  of stairs to twice a day for the first 2-3 days or as directed by your health care provider.  Do not squat for the first 2-3 days or as directed by your health care provider.  Do not lift over 10 lb (4.5 kg) for 5 days after your procedure or as directed by your health care provider.  Ask your health care provider  when it is okay to: ? Return to work or school. ? Resume usual physical activities or sports. ? Resume sexual activity.  Do not drive home if you are discharged the same day as the procedure. Have someone else drive you.  You may drive 24 hours after the procedure unless otherwise instructed by your health care provider.  Do not operate machinery or power tools for 24 hours after the procedure or as directed by your health care provider.  If your procedure was done as an outpatient procedure, which means that you went home the same day as your procedure, a responsible adult should be with you for the first 24 hours after you arrive home.  Keep all follow-up visits as directed by your health care provider. This is important. Contact a health care provider if:  You have a fever.  You have chills.  You have increased bleeding from the site. Hold pressure on the site. Get help right away if:  You have unusual pain at the site.  You have redness, warmth, or swelling at the site.  You have drainage (other than a small amount of blood on the dressing) from the site.  The site is bleeding, and the bleeding does not stop after 30 minutes of holding steady pressure on the site.  Your leg or foot becomes pale, cool, tingly, or numb. This information is not intended to replace advice given to you by your health care provider. Make sure you discuss any questions you have with your health care provider. Document Released: 12/16/2013 Document Revised: 09/20/2015 Document Reviewed: 11/01/2013 Elsevier Interactive Patient Education  2018 [employer]. No driving for 4 days. No lifting over 5 lbs for 1 week. No sexual activity for 1 week. You may return to work in 1 week. Keep procedure site clean & dry. If you notice increased pain, swelling, bleeding or pus, call/return!  You may shower, but no soaking baths/hot tubs/pools for 1 week.   You have an appointment set up with the [facility].  Multiple studies have shown that being followed by a dedicated atrial fibrillation clinic in addition to the standard care you receive from your other physicians improves health. We believe that enrollment in the atrial fibrillation clinic will allow [place] to better care for you.   The phone number to the [facility] is [phone]. The clinic is staffed Monday through Friday from 8:30am to 5pm.  Parking Directions: The clinic is located in the Heart and Vascular Building connected to [facility]. 1)From [address] turn on to [address] and go to the 3rd entrance  (Heart and Vascular entrance) on the right. 2)Look to the right for Heart &Vascular Parking Garage. 3)A code for the entrance is required please call the clinic to receive this.   4)Take the elevators to the 1st floor. Registration is in the room with the glass walls at the end of the hallway.  If you have any trouble parking or locating the clinic, please dont hesitate to call [phone].

## 2017-07-24 NOTE — Anesthesia Procedure Notes (Signed)
Procedure Name: Intubation Date/Time: 07/24/2017 7:55 AM Performed by: Josie Dixon, CRNA Pre-anesthesia Checklist: Patient identified, Emergency Drugs available, Suction available and Patient being monitored Patient Re-evaluated:Patient Re-evaluated prior to induction Oxygen Delivery Method: Circle system utilized Preoxygenation: Pre-oxygenation with 100% oxygen Induction Type: IV induction Ventilation: Mask ventilation without difficulty Laryngoscope Size: Mac and 3 Grade View: Grade II Tube type: Oral Tube size: 7.5 mm Number of attempts: 1 Airway Equipment and Method: Stylet and Oral airway Placement Confirmation: ETT inserted through vocal cords under direct vision,  positive ETCO2 and breath sounds checked- equal and bilateral Secured at: 22 cm Tube secured with: Tape Dental Injury: Teeth and Oropharynx as per pre-operative assessment

## 2017-07-24 NOTE — Anesthesia Preprocedure Evaluation (Addendum)
Anesthesia Evaluation  Patient identified by MRN, date of birth, ID band Patient awake    Reviewed: Allergy & Precautions, H&P , NPO status , Patient's Chart, lab work & pertinent test results  Airway Mallampati: II   Neck ROM: full    Dental   Pulmonary former smoker,    breath sounds clear to auscultation       Cardiovascular + dysrhythmias Atrial Fibrillation  Rhythm:irregular Rate:Normal     Neuro/Psych PSYCHIATRIC DISORDERS Anxiety    GI/Hepatic   Endo/Other    Renal/GU      Musculoskeletal   Abdominal   Peds  Hematology   Anesthesia Other Findings   Reproductive/Obstetrics                            Anesthesia Physical Anesthesia Plan  ASA: II  Anesthesia Plan: General   Post-op Pain Management:    Induction: Intravenous  PONV Risk Score and Plan: 2 and Ondansetron and Treatment may vary due to age or medical condition  Airway Management Planned: Oral ETT  Additional Equipment:   Intra-op Plan:   Post-operative Plan: Extubation in OR  Informed Consent: I have reviewed the patients History and Physical, chart, labs and discussed the procedure including the risks, benefits and alternatives for the proposed anesthesia with the patient or authorized representative who has indicated his/her understanding and acceptance.     Plan Discussed with: CRNA, Anesthesiologist and Surgeon  Anesthesia Plan Comments:         Anesthesia Quick Evaluation

## 2017-07-24 NOTE — Anesthesia Postprocedure Evaluation (Signed)
Anesthesia Post Note  Patient: Larry Mathis  Procedure(s) Performed: ATRIAL FIBRILLATION ABLATION (N/A )     Patient location during evaluation: PACU Anesthesia Type: General Level of consciousness: awake and alert Pain management: pain level controlled Vital Signs Assessment: post-procedure vital signs reviewed and stable Respiratory status: spontaneous breathing, nonlabored ventilation, respiratory function stable and patient connected to nasal cannula oxygen Cardiovascular status: blood pressure returned to baseline and stable Postop Assessment: no apparent nausea or vomiting Anesthetic complications: no    Last Vitals:  Vitals:   07/24/17 1210 07/24/17 1215  BP: 109/64 110/60  Pulse: 71 73  Resp: 12 14  Temp:    SpO2: 100% 99%    Last Pain:  Vitals:   07/24/17 1139  TempSrc: Temporal  PainSc:                  Myrene Bougher S

## 2017-07-24 NOTE — Progress Notes (Addendum)
Site area: RFV x 3 Site Prior to Removal:  Level 0 Pressure Applied For:30 min Manual:   yes Patient Status During Pull:  stable Post Pull Site:  Level 0 Post Pull Instructions Given:  yes Post Pull Pulses Present: palpable Dressing Applied:  tegaderm Bedrest begins @ 1230 till 1830 Comments:

## 2017-07-24 NOTE — Progress Notes (Signed)
Client's wife in and voiced concern that client's face is "flushed"; Dr Rayann Heman notified of her concern and no new orders noted- client without c/o chest pain or shortness of breath; client up and walked and tolerated well; right groin stable no bleeding or hematoma

## 2017-07-24 NOTE — Transfer of Care (Signed)
Immediate Anesthesia Transfer of Care Note  Patient: Larry Mathis  Procedure(s) Performed: ATRIAL FIBRILLATION ABLATION (N/A )  Patient Location: PACU  Anesthesia Type:General  Level of Consciousness: alert , oriented, drowsy and patient cooperative  Airway & Oxygen Therapy: Patient Spontanous Breathing and Patient connected to face mask oxygen  Post-op Assessment: Report given to RN and Post -op Vital signs reviewed and stable  Post vital signs: Reviewed and stable  Last Vitals:  Vitals Value Taken Time  BP 116/77 07/24/2017 11:04 AM  Temp    Pulse 72 07/24/2017 11:08 AM  Resp 17 07/24/2017 11:08 AM  SpO2 99 % 07/24/2017 11:08 AM  Vitals shown include unvalidated device data.  Last Pain:  Vitals:   07/24/17 0543  TempSrc: Oral         Complications: No apparent anesthesia complications

## 2017-07-24 NOTE — Interval H&P Note (Signed)
History and Physical Interval Note:  07/24/2017 6:59 AM  [name]  has presented today for surgery, with the diagnosis of afib  The various methods of treatment have been discussed with the patient and family. After consideration of risks, benefits and other options for treatment, the patient has consented to  Procedure(s): ATRIAL FIBRILLATION ABLATION (N/A) as a surgical intervention .  The patient's history has been reviewed, patient examined, no change in status, stable for surgery.  I have reviewed the patient's chart and labs.  Questions were answered to the patient's satisfaction.     Larry Mathis

## 2017-07-27 ENCOUNTER — Encounter (HOSPITAL_COMMUNITY): Payer: Self-pay | Admitting: Internal Medicine

## 2017-08-24 ENCOUNTER — Ambulatory Visit (HOSPITAL_COMMUNITY)
Admission: RE | Admit: 2017-08-24 | Discharge: 2017-08-24 | Disposition: A | Discharge status: Home / Self Care | Payer: BC Managed Care – PPO | Source: Ambulatory Visit | Attending: Nurse Practitioner | Admitting: Nurse Practitioner

## 2017-08-24 ENCOUNTER — Encounter (HOSPITAL_COMMUNITY): Payer: Self-pay | Admitting: Nurse Practitioner

## 2017-08-24 VITALS — BP 112/78 | HR 96 | Ht 75.0 in | Wt 215.0 lb

## 2017-08-24 DIAGNOSIS — Z79899 Other long term (current) drug therapy: Secondary | ICD-10-CM | POA: Insufficient documentation

## 2017-08-24 DIAGNOSIS — Z7901 Long term (current) use of anticoagulants: Secondary | ICD-10-CM | POA: Insufficient documentation

## 2017-08-24 DIAGNOSIS — Z803 Family history of malignant neoplasm of breast: Secondary | ICD-10-CM | POA: Diagnosis not present

## 2017-08-24 DIAGNOSIS — Z9889 Other specified postprocedural states: Secondary | ICD-10-CM | POA: Insufficient documentation

## 2017-08-24 DIAGNOSIS — I480 Paroxysmal atrial fibrillation: Secondary | ICD-10-CM | POA: Insufficient documentation

## 2017-08-24 DIAGNOSIS — Z8249 Family history of ischemic heart disease and other diseases of the circulatory system: Secondary | ICD-10-CM | POA: Insufficient documentation

## 2017-08-24 DIAGNOSIS — Z87891 Personal history of nicotine dependence: Secondary | ICD-10-CM | POA: Insufficient documentation

## 2017-08-24 NOTE — Progress Notes (Signed)
Primary Care Physician: Doreene Nest, MD Referring Physician: [facility] ER f/u EP: Dr. Esther Hardy [facility] is a 57 y.o. male with a h/o  Paroxysmal afib that is s/p ablation  Since 3/29. Pt reports that he has done well, no afib to mention and no swallowing or groin issues.   Today, he denies symptoms of palpitations, chest pain, shortness of breath, orthopnea, PND, lower extremity edema, dizziness, presyncope, syncope, or neurologic sequela. The patient is tolerating medications without difficulties and is otherwise without complaint today.   Past Medical History:  Diagnosis Date  . Anxiety   . Chest pain   . Fatigue   . Lightheadedness   . PAC (premature atrial contraction)   . SOB (shortness of breath)    Past Surgical History:  Procedure Laterality Date  . ATRIAL FIBRILLATION ABLATION N/A 07/24/2017   Procedure: ATRIAL FIBRILLATION ABLATION;  Surgeon: Thompson Grayer, MD;  Location: [place] [facility];  Service: Cardiovascular;  Laterality: N/A;  . BACK SURGERY    . HERNIA REPAIR      Current Outpatient Medications  Medication Sig Dispense Refill  . diltiazem (CARDIZEM) 30 MG tablet Take 1 tablet every 4 hours AS NEEDED for AFIB heart rate >100 45 tablet 1  . Multiple Vitamins-Minerals (MULTIVITAMIN PO) Take 1 tablet by mouth daily.    . Omega-3 Fatty Acids (FISH OIL) 1200 MG CAPS Take 1,200 mg by mouth daily.     . pantoprazole (PROTONIX) 40 MG tablet Take 1 tablet (40 mg total) by mouth daily. 45 tablet 0  . rivaroxaban (XARELTO) 20 MG TABS tablet Take 1 tablet (20 mg total) by mouth daily with supper. 30 tablet 11   No current facility-administered medications for this encounter.     No Known Allergies  Social History   Socioeconomic History  . Marital status: Married    Spouse name: Not on file  . Number of children: Not on file  . Years of education: Not on file  . Highest education level: Not on file  Occupational History  . Not on file  Social  Needs  . Financial resource strain: Not on file  . Food insecurity:    Worry: Not on file    Inability: Not on file  . Transportation needs:    Medical: Not on file    Non-medical: Not on file  Tobacco Use  . Smoking status: Former Research scientist (life sciences)  . Smokeless tobacco: Former Systems developer    Types: Snuff, Sarina Ser    Quit date: 08/19/1986  Substance and Sexual Activity  . Alcohol use: Yes  . Drug use: No  . Sexual activity: Not on file  Lifestyle  . Physical activity:    Days per week: Not on file    Minutes per session: Not on file  . Stress: Not on file  Relationships  . Social connections:    Talks on phone: Not on file    Gets together: Not on file    Attends religious service: Not on file    Active member of club or organization: Not on file    Attends meetings of clubs or organizations: Not on file    Relationship status: Not on file  . Intimate partner violence:    Fear of current or ex partner: Not on file    Emotionally abused: Not on file    Physically abused: Not on file    Forced sexual activity: Not on file  Other Topics Concern  . Not on  file  Social History Narrative  . Not on file    Family History  Problem Relation Age of Onset  . Cancer Mother        breast  . Cancer Father   . [place] syndrome Father   . Heart disease Brother   . Heart murmur Brother     ROS- All systems are reviewed and negative except as per the HPI above  Physical Exam: Vitals:   08/24/17 0911  BP: 112/78  Pulse: 96  Weight: 215 lb (97.5 kg)  Height: 6\' 3"  (1.905 m)   Wt Readings from Last 3 Encounters:  08/24/17 215 lb (97.5 kg)  07/24/17 214 lb (97.1 kg)  06/29/17 219 lb (99.3 kg)    Labs: Lab Results  Component Value Date   NA 140 07/17/2017   K 4.4 07/17/2017   CL 101 07/17/2017   CO2 26 07/17/2017   GLUCOSE 102 (H) 07/17/2017   BUN 22 07/17/2017   CREATININE 1.27 07/17/2017   CALCIUM 9.1 07/17/2017   No results found for: INR Lab Results  Component  Value Date   CHOL 180 05/22/2009   HDL 34.40 (L) 05/22/2009   LDLCALC 121 (H) 05/22/2009   TRIG 123.0 05/22/2009     GEN- The patient is well appearing, alert and oriented x 3 today.   Head- normocephalic, atraumatic Eyes-  Sclera clear, conjunctiva pink Ears- hearing intact Oropharynx- clear Neck- supple, no JVP Lymph- no cervical lymphadenopathy Lungs- Clear to ausculation bilaterally, normal work of breathing Heart- Regular rate and rhythm, no murmurs, rubs or gallops, PMI not laterally displaced GI- soft, NT, ND, + BS Extremities- no clubbing, cyanosis, or edema MS- no significant deformity or atrophy Skin- no rash or lesion Psych- euthymic mood, full affect Neuro- strength and sensation are intact  EKG-Sr at 86 bpm, pr int 126 ms, qrs int 90 ms, qtc 426 ms Epic records reviewed Echo 2011-Study Conclusions  - Left ventricle: The estimated ejection fraction was 60%. Wall  motion was normal; there were no regional wall motion  abnormalities. - Aortic valve: Mild regurgitation.   Assessment and Plan: 1. Paroxysmal afib S/p ablation  Has been enjoying SR since ablation, no known afib Has 30 mg SA Cardizem if needed    2. Chadsvasc score of 0 Stay on xarelto 20 mg daily until f/u with Dr. Rayann Heman  without interuption  Geroge Baseman. [place], [facility] [address] [place], [place] [address] [phone]

## 2017-10-21 ENCOUNTER — Ambulatory Visit: Payer: BC Managed Care – PPO | Admitting: Internal Medicine

## 2017-10-21 ENCOUNTER — Encounter: Payer: Self-pay | Admitting: Internal Medicine

## 2017-10-21 VITALS — BP 128/74 | HR 92 | Ht 75.0 in | Wt 212.0 lb

## 2017-10-21 DIAGNOSIS — I483 Typical atrial flutter: Secondary | ICD-10-CM

## 2017-10-21 DIAGNOSIS — I480 Paroxysmal atrial fibrillation: Secondary | ICD-10-CM

## 2017-10-21 NOTE — Progress Notes (Signed)
   PCP: Doreene Nest, MD  Larry Mathis is a 57 y.o. male who presents today for routine electrophysiology followup.  Since his recent afib ablation, the patient reports doing very well.  he denies procedure related complications and is pleased with the results of the procedure.  Today, he denies symptoms of palpitations, chest pain, shortness of breath,  lower extremity edema, dizziness, presyncope, or syncope.  The patient is otherwise without complaint today.   Past Medical History:  Diagnosis Date  . Anxiety   . Chest pain   . Fatigue   . Lightheadedness   . PAC (premature atrial contraction)   . SOB (shortness of breath)    Past Surgical History:  Procedure Laterality Date  . ATRIAL FIBRILLATION ABLATION N/A 07/24/2017   Procedure: ATRIAL FIBRILLATION ABLATION;  Surgeon: Thompson Grayer, MD;  Location: [place] [facility];  Service: Cardiovascular;  Laterality: N/A;  . BACK SURGERY    . HERNIA REPAIR      ROS- all systems are personally reviewed and negatives except as per HPI above  Current Outpatient Medications  Medication Sig Dispense Refill  . diltiazem (CARDIZEM) 30 MG tablet Take 1 tablet every 4 hours AS NEEDED for AFIB heart rate >100 45 tablet 1  . Multiple Vitamins-Minerals (MULTIVITAMIN PO) Take 1 tablet by mouth daily.    . Omega-3 Fatty Acids (FISH OIL) 1200 MG CAPS Take 1,200 mg by mouth daily.     . pantoprazole (PROTONIX) 40 MG tablet Take 1 tablet (40 mg total) by mouth daily. 45 tablet 0  . rivaroxaban (XARELTO) 20 MG TABS tablet Take 1 tablet (20 mg total) by mouth daily with supper. 30 tablet 11   No current facility-administered medications for this visit.     Physical Exam: Vitals:   10/21/17 0924  BP: 128/74  Pulse: 92  Weight: 212 lb (96.2 kg)  Height: 6\' 3"  (1.905 m)    GEN- The patient is well appearing, alert and oriented x 3 today.   Head- normocephalic, atraumatic Eyes-  Sclera clear, conjunctiva pink Ears- hearing  intact Oropharynx- clear Lungs- Clear to ausculation bilaterally, normal work of breathing Heart- Regular rate and rhythm, no murmurs, rubs or gallops, PMI not laterally displaced GI- soft, NT, ND, + BS Extremities- no clubbing, cyanosis, or edema  EKG tracing ordered today is personally reviewed and shows sinus rhythm 92 bpm, PR 124 msec, QTc 435 msec  Assessment and Plan:  1. Paroxysmal atrial fibrillation/ typical atrial flutter Doing well s/p ablation chads2vasc score is 0 Stop anticoagulation at this time.  Return to see me in 3 months  Thompson Grayer MD, [facility] 10/21/2017 9:46 AM

## 2017-10-21 NOTE — Patient Instructions (Addendum)
Medication Instructions:  Your physician has recommended you make the following change in your medication:   1.  Stop taking Xarelto   Labwork: None ordered.  Testing/Procedures: None ordered.  Follow-Up: Your physician wants you to follow-up in: 3 months with Dr. Rayann Heman.     Any Other Special Instructions Will Be Listed Below (If Applicable).  If you need a refill on your cardiac medications before your next appointment, please call your pharmacy.

## 2018-01-21 ENCOUNTER — Encounter: Payer: Self-pay | Admitting: Internal Medicine

## 2018-02-08 ENCOUNTER — Encounter (INDEPENDENT_AMBULATORY_CARE_PROVIDER_SITE_OTHER): Payer: Self-pay

## 2018-02-08 ENCOUNTER — Ambulatory Visit: Payer: BC Managed Care – PPO | Admitting: Internal Medicine

## 2018-02-08 ENCOUNTER — Encounter: Payer: Self-pay | Admitting: Internal Medicine

## 2018-02-08 VITALS — BP 122/74 | HR 81 | Ht 75.0 in | Wt 210.2 lb

## 2018-02-08 DIAGNOSIS — I480 Paroxysmal atrial fibrillation: Secondary | ICD-10-CM | POA: Diagnosis not present

## 2018-02-08 DIAGNOSIS — I483 Typical atrial flutter: Secondary | ICD-10-CM

## 2018-02-08 NOTE — Patient Instructions (Addendum)
Medication Instructions:  Your physician recommends that you continue on your current medications as directed. Please refer to the Current Medication list given to you today.  Labwork: None ordered.  Testing/Procedures: None ordered.  Follow-Up: Your physician wants you to follow-up in: 4 months with Chanetta Marshall, NP.   You will receive a reminder letter in the mail two months in advance. If you don't receive a letter, please call our office to schedule the follow-up appointment.   Any Other Special Instructions Will Be Listed Below (If Applicable).  If you need a refill on your cardiac medications before your next appointment, please call your pharmacy.

## 2018-02-08 NOTE — Progress Notes (Signed)
   PCP: Doreene Nest, MD Primary Cardiology:  Dr Abelardo Diesel Larry Mathis is a 57 y.o. male who presents today for routine electrophysiology followup.  Since last being seen in our clinic, the patient reports doing very well.  Today, he denies symptoms of palpitations, chest pain, shortness of breath,  lower extremity edema, dizziness, presyncope, or syncope.  The patient is otherwise without complaint today.   Past Medical History:  Diagnosis Date  . Anxiety   . Chest pain   . Fatigue   . Lightheadedness   . PAC (premature atrial contraction)   . Paroxysmal atrial fibrillation (HCC)   . SOB (shortness of breath)    Past Surgical History:  Procedure Laterality Date  . ATRIAL FIBRILLATION ABLATION N/A 07/24/2017   Procedure: ATRIAL FIBRILLATION ABLATION;  Surgeon: Thompson Grayer, MD;  Location: [place] [facility];  Service: Cardiovascular;  Laterality: N/A;  . BACK SURGERY    . HERNIA REPAIR      ROS- all systems are reviewed and negatives except as per HPI above  Current Outpatient Medications  Medication Sig Dispense Refill  . diltiazem (CARDIZEM) 30 MG tablet Take 1 tablet every 4 hours AS NEEDED for AFIB heart rate >100 45 tablet 1  . Multiple Vitamins-Minerals (MULTIVITAMIN PO) Take 1 tablet by mouth daily.    . Omega-3 Fatty Acids (FISH OIL) 1200 MG CAPS Take 1,200 mg by mouth daily.     . pantoprazole (PROTONIX) 40 MG tablet Take 1 tablet (40 mg total) by mouth daily. 45 tablet 0   No current facility-administered medications for this visit.     Physical Exam: Vitals:   02/08/18 0838  BP: 122/74  Pulse: 81  SpO2: 98%  Weight: 210 lb 3.2 oz (95.3 kg)  Height: 6\' 3"  (1.905 m)    GEN- The patient is well appearing, alert and oriented x 3 today.   Head- normocephalic, atraumatic Eyes-  Sclera clear, conjunctiva pink Ears- hearing intact Oropharynx- clear Lungs- Clear to ausculation bilaterally, normal work of breathing Heart- Regular rate and rhythm, no  murmurs, rubs or gallops, PMI not laterally displaced GI- soft, NT, ND, + BS Extremities- no clubbing, cyanosis, or edema  Wt Readings from Last 3 Encounters:  02/08/18 210 lb 3.2 oz (95.3 kg)  10/21/17 212 lb (96.2 kg)  08/24/17 215 lb (97.5 kg)    EKG tracing ordered today is personally reviewed and shows sinus rhythm 81 bpm< PR 128 msec, QRS 94 msec, QTc 420 msec  Assessment and Plan:  1. Paroxysmal atrial fibrillation/ typical atrial flutter Doing well post ablation off AADs chads2vasc score is 0.  As per guidelines, he is not on [place] at this time.  Return in 4 months to see EP NP  Thompson Grayer MD, [facility] 02/08/2018 8:50 AM

## 2018-02-12 ENCOUNTER — Telehealth: Payer: Self-pay

## 2018-02-12 ENCOUNTER — Ambulatory Visit (INDEPENDENT_AMBULATORY_CARE_PROVIDER_SITE_OTHER): Payer: BC Managed Care – PPO | Admitting: Family Medicine

## 2018-02-12 ENCOUNTER — Encounter: Payer: Self-pay | Admitting: Family Medicine

## 2018-02-12 VITALS — BP 136/76 | HR 87 | Temp 98.3°F | Ht 74.25 in | Wt 212.2 lb

## 2018-02-12 DIAGNOSIS — Z0000 Encounter for general adult medical examination without abnormal findings: Secondary | ICD-10-CM

## 2018-02-12 DIAGNOSIS — Z23 Encounter for immunization: Secondary | ICD-10-CM

## 2018-02-12 DIAGNOSIS — R7301 Impaired fasting glucose: Secondary | ICD-10-CM | POA: Diagnosis not present

## 2018-02-12 DIAGNOSIS — Z1211 Encounter for screening for malignant neoplasm of colon: Secondary | ICD-10-CM

## 2018-02-12 DIAGNOSIS — Z2821 Immunization not carried out because of patient refusal: Secondary | ICD-10-CM | POA: Insufficient documentation

## 2018-02-12 DIAGNOSIS — Z1212 Encounter for screening for malignant neoplasm of rectum: Secondary | ICD-10-CM

## 2018-02-12 NOTE — Telephone Encounter (Signed)
Pt unable to come in sooner due to work

## 2018-02-12 NOTE — Progress Notes (Signed)
Larry Mathis is a 57 y.o. male  Chief Complaint  Patient presents with  . New Patient (Initial Visit)    Patient is here today to establish care.  He would like to have a CPE today.  He is not currently fasting.  He agrees to be referred back to [facility] for Colononoscopy.  He declines the flu shot but agrees to the Tdap. He declines the HIV and Hep-C lab draw.      HPI: Pt is new to our office to establish care and have an annual CPE.  Last Colonoscopy: over due, has seen [facility] in the past He follows with cardio as he is s/p ablation in 06/2017. He was seen about 2 weeks ago and will f/u again in 05/2018. He takes cardizem PRN and states he has only taken 1 tab since ablation. He has no concerns today.  He will get a Tdap but declines flu vaccine as well as Hep C and HIV testing. He has BMP, CBC, TSH done in 06/2017 but is due for FLP and also A1C as his fasting BS has been elevated in the past.   Diet/Exercise: average  Med refills needed today: none  Past Medical History:  Diagnosis Date  . Anxiety   . Chest pain   . Fatigue   . Lightheadedness   . PAC (premature atrial contraction)   . Paroxysmal atrial fibrillation (HCC)   . SOB (shortness of breath)     Past Surgical History:  Procedure Laterality Date  . ATRIAL FIBRILLATION ABLATION N/A 07/24/2017   Procedure: ATRIAL FIBRILLATION ABLATION;  Surgeon: Thompson Grayer, MD;  Location: [facility];  Service: Cardiovascular;  Laterality: N/A;  . BACK SURGERY    . HERNIA REPAIR      Social History   Socioeconomic History  . Marital status: Married    Spouse name: Not on file  . Number of children: Not on file  . Years of education: Not on file  . Highest education level: Not on file  Occupational History  . Not on file  Social Needs  . Financial resource strain: Not on file  . Food insecurity:    Worry: Not on file    Inability: Not on file  . Transportation needs:    Medical: Not on file   Non-medical: Not on file  Tobacco Use  . Smoking status: Former Research scientist (life sciences)  . Smokeless tobacco: Former Systems developer    Types: Snuff, Sarina Ser    Quit date: 08/19/1986  Substance and Sexual Activity  . Alcohol use: Yes  . Drug use: No  . Sexual activity: Not on file  Lifestyle  . Physical activity:    Days per week: Not on file    Minutes per session: Not on file  . Stress: Not on file  Relationships  . Social connections:    Talks on phone: Not on file    Gets together: Not on file    Attends religious service: Not on file    Active member of club or organization: Not on file    Attends meetings of clubs or organizations: Not on file    Relationship status: Not on file  . Intimate partner violence:    Fear of current or ex partner: Not on file    Emotionally abused: Not on file    Physically abused: Not on file    Forced sexual activity: Not on file  Other Topics Concern  . Not on file  Social  History Narrative  . Not on file    Family History  Problem Relation Age of Onset  . Cancer Mother        breast  . Cancer Father   . [place] White syndrome Father   . Heart disease Brother   . Heart murmur Brother      Immunization History  Administered Date(s) Administered  . Tdap 02/12/2018    Outpatient Encounter Medications as of 02/12/2018  Medication Sig  . diltiazem (CARDIZEM) 30 MG tablet Take 1 tablet every 4 hours AS NEEDED for AFIB heart rate >100  . Multiple Vitamins-Minerals (MULTIVITAMIN PO) Take 1 tablet by mouth daily.  . Omega-3 Fatty Acids (FISH OIL) 1200 MG CAPS Take 1,200 mg by mouth daily.   . [DISCONTINUED] pantoprazole (PROTONIX) 40 MG tablet Take 1 tablet (40 mg total) by mouth daily.   No facility-administered encounter medications on file as of 02/12/2018.      ROS: Gen: no fever, chills  Skin: no rash, itching ENT: no ear pain, ear drainage, nasal congestion, rhinorrhea, sinus pressure, sore throat Eyes: no blurry vision, double vision Resp:  no cough, wheeze,SOB CV: no CP, palpitations, LE edema,  GI: no heartburn, n/v/d/c, abd pain GU: no dysuria, urgency, frequency, hematuria; no testicular swelling or masses MSK: no joint pain, myalgias, back pain Neuro: no dizziness, headache, weakness, vertigo Psych: no depression, anxiety, insomnia   No Known Allergies  BP 136/76 (BP Location: Left Arm, Patient Position: Sitting, Cuff Size: Normal)   Pulse 87   Temp 98.3 F (36.8 C) (Oral)   Ht 6' 2.25" (1.886 m)   Wt 212 lb 3.2 oz (96.3 kg)   SpO2 97%   BMI 27.06 kg/m   Physical Exam  Constitutional: He is oriented to person, place, and time. He appears well-developed and well-nourished. No distress.  HENT:  Head: Normocephalic and atraumatic.  Right Ear: External ear normal.  Left Ear: External ear normal.  Nose: Nose normal.  Mouth/Throat: Oropharynx is clear and moist.  Eyes: Pupils are equal, round, and reactive to light. Conjunctivae are normal.  Neck: Neck supple. No thyromegaly present.  Cardiovascular: Normal rate, regular rhythm, normal heart sounds and intact distal pulses.  No murmur heard. Respiratory: Effort normal and breath sounds normal. No respiratory distress. He has no wheezes.  GI: Soft. Bowel sounds are normal. He exhibits no distension. There is no tenderness.  Musculoskeletal: He exhibits no edema or deformity.  Lymphadenopathy:    He has no cervical adenopathy.  Neurological: He is alert and oriented to person, place, and time. He has normal reflexes.  Skin: Skin is warm and dry. No rash noted.  Psychiatric: He has a normal mood and affect.   A/P:  1. Need for Tdap vaccination - Tdap vaccine greater than or equal to 7yo IM  2. Visit for well man health check - declines flu vaccine, agrees to Tdap vaccine - referral for colonoscopy placed today - stressed importance of healthy diet and regular exercise - UTD on dental exam, needs eye exam - Lipid panel; Future - Hemoglobin A1c; Future -  next CPE in 1 year  3. Screening for colorectal cancer - Ambulatory referral to Gastroenterology  4. Elevated fasting blood sugar - Hemoglobin A1c; Future  5. Influenza vaccination declined by patient

## 2018-02-12 NOTE — Patient Instructions (Signed)

## 2018-02-12 NOTE — Telephone Encounter (Signed)
PEC-If the patient calls and wants to move his appointment time up that is just fine  I left him a VM that we have earlier options if it would suit him better/thx dmf

## 2018-02-15 ENCOUNTER — Encounter: Payer: Self-pay | Admitting: Gastroenterology

## 2018-02-16 ENCOUNTER — Other Ambulatory Visit (INDEPENDENT_AMBULATORY_CARE_PROVIDER_SITE_OTHER): Payer: BC Managed Care – PPO

## 2018-02-16 DIAGNOSIS — R7301 Impaired fasting glucose: Secondary | ICD-10-CM | POA: Diagnosis not present

## 2018-02-16 DIAGNOSIS — Z0000 Encounter for general adult medical examination without abnormal findings: Secondary | ICD-10-CM

## 2018-02-16 LAB — LIPID PANEL
CHOL/HDL RATIO: 5
Cholesterol: 176 mg/dL (ref 0–200)
HDL: 37.9 mg/dL — AB (ref >39.00)
LDL CALC: 126 mg/dL — AB (ref 0–99)
NonHDL: 137.92
TRIGLYCERIDES: 58 mg/dL (ref 0.0–149.0)
VLDL: 11.6 mg/dL (ref 0.0–40.0)

## 2018-02-16 LAB — HEMOGLOBIN A1C: Hgb A1c MFr Bld: 5.5 % (ref 4.6–6.5)

## 2018-03-15 ENCOUNTER — Other Ambulatory Visit: Payer: Self-pay

## 2018-03-15 ENCOUNTER — Ambulatory Visit (AMBULATORY_SURGERY_CENTER): Payer: Self-pay

## 2018-03-15 ENCOUNTER — Encounter: Payer: Self-pay | Admitting: Gastroenterology

## 2018-03-15 VITALS — Ht 75.0 in | Wt 209.4 lb

## 2018-03-15 DIAGNOSIS — Z1211 Encounter for screening for malignant neoplasm of colon: Secondary | ICD-10-CM

## 2018-03-15 MED ORDER — NA SULFATE-K SULFATE-MG SULF 17.5-3.13-1.6 GM/177ML PO SOLN: 1.0000 | Freq: Once | ORAL | 0 refills | Status: AC

## 2018-03-15 NOTE — Progress Notes (Signed)
No egg or soy allergy known to patient  No issues with past sedation with any surgeries  or procedures, no intubation problems  No diet pills per patient No home 02 use per patient  No blood thinners per patient  Pt denies issues with constipation   A fib taking prn diltiazem 30mg  as needed EMMI video sent to pt's e mail

## 2018-03-30 ENCOUNTER — Encounter: Payer: Self-pay | Admitting: Gastroenterology

## 2018-03-30 ENCOUNTER — Ambulatory Visit (AMBULATORY_SURGERY_CENTER): Payer: BC Managed Care – PPO | Admitting: Gastroenterology

## 2018-03-30 VITALS — BP 112/73 | HR 71 | Temp 99.3°F | Resp 22 | Ht 75.0 in | Wt 209.0 lb

## 2018-03-30 DIAGNOSIS — D122 Benign neoplasm of ascending colon: Secondary | ICD-10-CM | POA: Diagnosis not present

## 2018-03-30 DIAGNOSIS — Z1211 Encounter for screening for malignant neoplasm of colon: Secondary | ICD-10-CM | POA: Diagnosis present

## 2018-03-30 MED ORDER — SODIUM CHLORIDE 0.9 % IV SOLN: 500.0000 mL | Freq: Once | INTRAVENOUS | Status: DC

## 2018-03-30 NOTE — Progress Notes (Signed)
Called to room to assist during endoscopic procedure.  Patient ID and intended procedure confirmed with present staff. Received instructions for my participation in the procedure from the performing physician.  

## 2018-03-30 NOTE — Op Note (Signed)
[name] Procedure Date: 03/30/2018 8:03 AM MRN: [id number] Endoscopist: Mallie Mussel L. Loletha Carrow , MD Age: 57 Referring MD:  Date of Birth: 02-26-1961 Gender: Male Account #: [id number] Procedure:                Colonoscopy Indications:              Screening for colorectal malignant neoplasm, This                            is the patient's first colonoscopy Medicines:                Monitored Anesthesia Care Procedure:                Pre-Anesthesia Assessment:                           - Prior to the procedure, a History and Physical                            was performed, and patient medications and                            allergies were reviewed. The patient's tolerance of                            previous anesthesia was also reviewed. The risks                            and benefits of the procedure and the sedation                            options and risks were discussed with the patient.                            All questions were answered, and informed consent                            was obtained. Prior Anticoagulants: The patient has                            taken no previous anticoagulant or antiplatelet                            agents. ASA Grade Assessment: II - A patient with                            mild systemic disease. After reviewing the risks                            and benefits, the patient was deemed in                            satisfactory condition to undergo the procedure.  After obtaining informed consent, the colonoscope                            was passed under direct vision. Throughout the                            procedure, the patient's blood pressure, pulse, and                            oxygen saturations were monitored continuously. The                            Colonoscope was introduced through the anus and                            advanced to the the cecum,  identified by                            appendiceal orifice and ileocecal valve. The                            colonoscopy was performed without difficulty. The                            patient tolerated the procedure well. The quality                            of the bowel preparation was good after lavage. The                            ileocecal valve, appendiceal orifice, and rectum                            were photographed. The bowel preparation used was                            SUPREP. Scope In: 8:07:05 AM Scope Out: 8:28:50 AM Scope Withdrawal Time: 0 hours 14 minutes 50 seconds  Total Procedure Duration: 0 hours 21 minutes 45 seconds  Findings:                 The perianal and digital rectal examinations were                            normal.                           The left colon was tortuous.                           A 3 mm polyp was found in the ascending colon. The                            polyp was sessile. The polyp was removed with a  cold snare. Resection and retrieval were complete.                           The exam was otherwise without abnormality on                            direct and retroflexion views. Complications:            No immediate complications. Estimated Blood Loss:     Estimated blood loss was minimal. Impression:               - Tortuous colon.                           - One 3 mm polyp in the ascending colon, removed                            with a cold snare. Resected and retrieved.                           - The examination was otherwise normal on direct                            and retroflexion views. Recommendation:           - Patient has a contact number available for                            emergencies. The signs and symptoms of potential                            delayed complications were discussed with the                            patient. Return to normal activities tomorrow.                             Written discharge instructions were provided to the                            patient.                           - Resume previous diet.                           - Continue present medications.                           - Await pathology results.                           - Repeat colonoscopy is recommended for                            surveillance. The colonoscopy date will be  determined after pathology results from today's                            exam become available for review. Anahi Belmar L. Loletha Carrow, MD 03/30/2018 8:32:56 AM This report has been signed electronically.

## 2018-03-30 NOTE — Progress Notes (Signed)
Report given to PACU, vss 

## 2018-03-30 NOTE — Progress Notes (Signed)
Pt's states no medical or surgical changes since previsit or office visit. 

## 2018-03-31 ENCOUNTER — Telehealth: Payer: Self-pay | Admitting: *Deleted

## 2018-03-31 NOTE — Telephone Encounter (Signed)
  Follow up Call-  Call back number 03/30/2018  Post procedure Call Back phone  # [phone]  Permission to leave phone message Yes  Some recent data might be hidden     Patient questions:  Do you have a fever, pain , or abdominal swelling? No. Pain Score  0 *  Have you tolerated food without any problems? Yes.    Have you been able to return to your normal activities? Yes.    Do you have any questions about your discharge instructions: Diet   No. Medications  No. Follow up visit  No.  Do you have questions or concerns about your Care? No.  Actions: * If pain score is 4 or above: No action needed, pain <4.

## 2018-04-02 ENCOUNTER — Encounter: Payer: Self-pay | Admitting: Gastroenterology

## 2018-04-07 ENCOUNTER — Ambulatory Visit: Payer: BC Managed Care – PPO | Admitting: Family Medicine

## 2018-04-07 ENCOUNTER — Encounter: Payer: Self-pay | Admitting: Family Medicine

## 2018-04-07 VITALS — BP 126/80 | HR 84 | Temp 97.5°F | Ht 75.0 in | Wt 214.4 lb

## 2018-04-07 DIAGNOSIS — K5900 Constipation, unspecified: Secondary | ICD-10-CM

## 2018-04-07 MED ORDER — DOCUSATE SODIUM 100 MG PO CAPS: 100.0000 mg | ORAL_CAPSULE | Freq: Two times a day (BID) | ORAL | 3 refills | Status: DC

## 2018-04-07 MED ORDER — POLYETHYLENE GLYCOL 3350 17 GM/SCOOP PO POWD: ORAL | 1 refills | Status: DC

## 2018-04-07 NOTE — Patient Instructions (Addendum)
Continue high fiber diet. Benefiber is a good fiber supplement if needed Colace 100mg  1 tab twice per day Miralax 1 capful in 6-8 oz of water daily to twice per day - start twice per day and decrease, if needed, to daily Continue drinking plenty of water Use enema, laxative as needed Follow-up in 2-3 wks if no or minimal improvement

## 2018-04-07 NOTE — Progress Notes (Signed)
Larry Mathis is a 57 y.o. male  Chief Complaint  Patient presents with  . Constipation    HPI: Larry Mathis is a 57 y.o. male complains of constipation x 2 months. Pt states he has a BM every 4 days or so. Prior to this, pt was having BM 1-2x/day. He states when he does have a BM, he has to strain and stools are usually a "collection of tiny balls". He has used an enema 1-2x in order to have BM. No blood in stool.   He has tried OTC meds - laxatives, miralax - without significant improvement.Marland Kitchen Appetite is good/normal. No n/v. No abdominal pain. No fever, chills. No bloating.  Pt has 16oz water bottle and drinks 5-7 of these in 24 hr period. His diet has not changed and he feels he gets enough fiber in his diet. He has recently return to gym and is working out much more.  He states he has had milder, more sporadic issues with constipation in the past, but not to this extent.  No new or increased stress.  He had a screening colonoscopy on 03/30/18 with Dr. Loletha Carrow and was told it was normal and 1 polyp was removed. Report shows 1 45mm polyp in ascending colon and tortuous colon.  Past Medical History:  Diagnosis Date  . Anxiety   . Chest pain   . Fatigue   . Lightheadedness   . PAC (premature atrial contraction)   . Paroxysmal atrial fibrillation (HCC)   . SOB (shortness of breath)     Past Surgical History:  Procedure Laterality Date  . ATRIAL FIBRILLATION ABLATION N/A 07/24/2017   Procedure: ATRIAL FIBRILLATION ABLATION;  Surgeon: Thompson Grayer, MD;  Location: [facility];  Service: Cardiovascular;  Laterality: N/A;  . BACK SURGERY    . COLONOSCOPY    . HERNIA REPAIR      Social History   Socioeconomic History  . Marital status: Married    Spouse name: Not on file  . Number of children: Not on file  . Years of education: Not on file  . Highest education level: Not on file  Occupational History  . Not on file  Social Needs  . Financial resource strain: Not  on file  . Food insecurity:    Worry: Not on file    Inability: Not on file  . Transportation needs:    Medical: Not on file    Non-medical: Not on file  Tobacco Use  . Smoking status: Former Research scientist (life sciences)  . Smokeless tobacco: Former Systems developer    Types: Snuff, Sarina Ser    Quit date: 08/19/1986  Substance and Sexual Activity  . Alcohol use: Yes  . Drug use: No  . Sexual activity: Not on file  Lifestyle  . Physical activity:    Days per week: Not on file    Minutes per session: Not on file  . Stress: Not on file  Relationships  . Social connections:    Talks on phone: Not on file    Gets together: Not on file    Attends religious service: Not on file    Active member of club or organization: Not on file    Attends meetings of clubs or organizations: Not on file    Relationship status: Not on file  . Intimate partner violence:    Fear of current or ex partner: Not on file    Emotionally abused: Not on file    Physically abused: Not on file  Forced sexual activity: Not on file  Other Topics Concern  . Not on file  Social History Narrative  . Not on file    Family History  Problem Relation Age of Onset  . Cancer Mother        breast  . Cancer Father   . [place] White syndrome Father   . Heart disease Brother   . Heart murmur Brother   . Colon cancer Neg Hx   . Esophageal cancer Neg Hx   . Rectal cancer Neg Hx   . Stomach cancer Neg Hx      Immunization History  Administered Date(s) Administered  . Tdap 02/12/2018    Outpatient Encounter Medications as of 04/07/2018  Medication Sig Note  . Multiple Vitamins-Minerals (MULTIVITAMIN PO) Take 1 tablet by mouth daily.   . Omega-3 Fatty Acids (FISH OIL) 1200 MG CAPS Take 1,200 mg by mouth daily.    Marland Kitchen diltiazem (CARDIZEM) 30 MG tablet Take 1 tablet every 4 hours AS NEEDED for AFIB heart rate >100 (Patient not taking: Reported on 03/30/2018) 03/15/2018: Lat taken in June 2019   No facility-administered encounter  medications on file as of 04/07/2018.      ROS: Gen: no fever, chills  Skin: no rash, itching Resp: no cough, wheeze,SOB CV: no CP, palpitations, LE edema,  GI: no heartburn, n/v/d, abd pain; + constipation GU: no dysuria, urgency, frequency, hematuria  MSK: no joint pain, myalgias, back pain Neuro: no dizziness, headache, weakness, vertigo Psych: no depression, anxiety, insomnia   No Known Allergies  BP 126/80   Pulse 84   Temp (!) 97.5 F (36.4 C) (Oral)   Ht 6\' 3"  (1.905 m)   Wt 214 lb 6 oz (97.2 kg)   SpO2 97%   BMI 26.80 kg/m   Physical Exam  Constitutional: He is oriented to person, place, and time. He appears well-developed and well-nourished. No distress.  Neck: Neck supple.  Cardiovascular: Normal rate, regular rhythm and normal heart sounds.  Pulmonary/Chest: Effort normal and breath sounds normal. He has no wheezes. He has no rhonchi.  Abdominal: Soft. Bowel sounds are normal. He exhibits no distension and no mass. There is no tenderness. There is no rebound and no guarding.  Neurological: He is alert and oriented to person, place, and time.  Skin: Skin is warm and dry.  Psychiatric: He has a normal mood and affect. His behavior is normal.     A/P:  1. Constipation, unspecified constipation type - symptoms x 2 mo - screening colonoscopy done 03/30/18 - 1 71mm polyp, otherwise normal - continue with increased water intake and high fiber diet Rx: - docusate sodium (COLACE) 100 MG capsule; Take 1 capsule (100 mg total) by mouth 2 (two) times daily.  Dispense: 180 capsule; Refill: 3 - polyethylene glycol powder (GLYCOLAX/MIRALAX) powder; 1 capful in 6-8oz water daily-BID  Dispense: 850 g; Refill: 1 - pt to decrease to daily if he starts having multiple BM per day - f/u in 2-3 wks of no or minimal improvement Discussed plan and reviewed medications with patient, including risks, benefits, and potential side effects. Pt expressed understand. All questions  answered.

## 2019-03-09 ENCOUNTER — Other Ambulatory Visit: Payer: Self-pay

## 2019-03-09 ENCOUNTER — Ambulatory Visit: Payer: BC Managed Care – PPO | Admitting: Family Medicine

## 2019-03-09 ENCOUNTER — Encounter: Payer: Self-pay | Admitting: Family Medicine

## 2019-03-09 VITALS — BP 126/70 | HR 100 | Ht 75.0 in | Wt 216.0 lb

## 2019-03-09 DIAGNOSIS — K5900 Constipation, unspecified: Secondary | ICD-10-CM

## 2019-03-09 DIAGNOSIS — R1011 Right upper quadrant pain: Secondary | ICD-10-CM | POA: Diagnosis not present

## 2019-03-09 NOTE — Progress Notes (Signed)
Larry Mathis is a 58 y.o. male   Chief Complaint  Patient presents with  . pain in right side    under rib cage, ongoing for a few months.    HPI: Larry Mathis is a 58 y.o. male complains of Rt sided abdominal pain, usually dull but at times can be sharp. Pain is intermittent but occurring more frequently in the past few months. Symptoms x 11-12 mo. No specific injury or trauma. He states he feels this area/discomfort more if he lifts something heavy. No association with food.  Pt also states he is eating less, eats half of a restaurant meal then takes rest home. No early satiety. No n/v. No diarrhea. No fever, chills. No blood in stool. Denies heartburn/reflux symptoms. He still is having issues with constipation and having BM every 3-4 days using laxative. He saw me in 03/2018 for constipation and states with stool softener and miralax he was able to have soft, regular BM. He stopped these and BM became less frequent and he is back to using laxative every 3-4 days.   Wt Readings from Last 3 Encounters:  03/09/19 216 lb (98 kg)  04/07/18 214 lb 6 oz (97.2 kg)  03/30/18 209 lb (94.8 kg)    Past Medical History:  Diagnosis Date  . Anxiety   . Chest pain   . Fatigue   . Lightheadedness   . PAC (premature atrial contraction)   . Paroxysmal atrial fibrillation (HCC)   . SOB (shortness of breath)     Past Surgical History:  Procedure Laterality Date  . ATRIAL FIBRILLATION ABLATION N/A 07/24/2017   Procedure: ATRIAL FIBRILLATION ABLATION;  Surgeon: Thompson Grayer, MD;  Location: [facility];  Service: Cardiovascular;  Laterality: N/A;  . BACK SURGERY    . COLONOSCOPY    . HERNIA REPAIR      Social History   Socioeconomic History  . Marital status: Married    Spouse name: Not on file  . Number of children: Not on file  . Years of education: Not on file  . Highest education level: Not on file  Occupational History  . Not on file  Social Needs  . Financial  resource strain: Not on file  . Food insecurity    Worry: Not on file    Inability: Not on file  . Transportation needs    Medical: Not on file    Non-medical: Not on file  Tobacco Use  . Smoking status: Former Research scientist (life sciences)  . Smokeless tobacco: Former Systems developer    Types: Snuff, Sarina Ser    Quit date: 08/19/1986  Substance and Sexual Activity  . Alcohol use: Yes  . Drug use: No  . Sexual activity: Not on file  Lifestyle  . Physical activity    Days per week: Not on file    Minutes per session: Not on file  . Stress: Not on file  Relationships  . Social Herbalist on phone: Not on file    Gets together: Not on file    Attends religious service: Not on file    Active member of club or organization: Not on file    Attends meetings of clubs or organizations: Not on file    Relationship status: Not on file  . Intimate partner violence    Fear of current or ex partner: Not on file    Emotionally abused: Not on file    Physically abused: Not on file  Forced sexual activity: Not on file  Other Topics Concern  . Not on file  Social History Narrative  . Not on file    Family History  Problem Relation Age of Onset  . Cancer Mother        breast  . Cancer Father   . [place] White syndrome Father   . Heart disease Brother   . Heart murmur Brother   . Colon cancer Neg Hx   . Esophageal cancer Neg Hx   . Rectal cancer Neg Hx   . Stomach cancer Neg Hx      Immunization History  Administered Date(s) Administered  . Tdap 02/12/2018    Outpatient Encounter Medications as of 03/09/2019  Medication Sig Note  . docusate sodium (COLACE) 100 MG capsule Take 1 capsule (100 mg total) by mouth 2 (two) times daily.   . Multiple Vitamins-Minerals (MULTIVITAMIN PO) Take 1 tablet by mouth daily.   . Omega-3 Fatty Acids (FISH OIL) 1200 MG CAPS Take 1,200 mg by mouth daily.    . polyethylene glycol powder (GLYCOLAX/MIRALAX) powder 1 capful in 6-8oz water daily-BID   . diltiazem  (CARDIZEM) 30 MG tablet Take 1 tablet every 4 hours AS NEEDED for AFIB heart rate >100 (Patient not taking: Reported on 03/30/2018) 03/15/2018: Lat taken in June 2019   No facility-administered encounter medications on file as of 03/09/2019.      ROS: Pertinent positives and negatives noted in HPI. Remainder of ROS non-contributory.   No Known Allergies  BP 126/70   Pulse 100   Ht 6\' 3"  (1.905 m)   Wt 216 lb (98 kg)   SpO2 96%   BMI 27.00 kg/m   Physical Exam  Constitutional: He is oriented to person, place, and time. He appears well-developed and well-nourished. No distress.  Abdominal: Soft. Bowel sounds are normal. He exhibits no distension and no mass. There is no hepatosplenomegaly. There is abdominal tenderness (RUQ and RMQ TTP w/o rebound or guarding). There is no rebound, no guarding and no CVA tenderness.  Neurological: He is alert and oriented to person, place, and time.  Psychiatric: He has a normal mood and affect. His behavior is normal.     A/P:  1. RUQ discomfort - symptoms x 11-108mo, intermittent, not associated with food as per pt - US Abdomen Limited RUQ; Future - Basic metabolic panel - Hepatic function panel - CBC - pt would like to be called with results and he does not accessMyChart  2. Constipation, unspecified constipation type - miralax daily - cont with 64oz water daily, high fiber diet, regular exercise - colonoscopy UTD - done in 03/2018

## 2019-03-10 LAB — HEPATIC FUNCTION PANEL
ALT: 30 U/L (ref 0–53)
AST: 26 U/L (ref 0–37)
Albumin: 4.3 g/dL (ref 3.5–5.2)
Alkaline Phosphatase: 60 U/L (ref 39–117)
Bilirubin, Direct: 0.1 mg/dL (ref 0.0–0.3)
Total Bilirubin: 0.4 mg/dL (ref 0.2–1.2)
Total Protein: 7.1 g/dL (ref 6.0–8.3)

## 2019-03-10 LAB — BASIC METABOLIC PANEL
BUN: 17 mg/dL (ref 6–23)
CO2: 29 mEq/L (ref 19–32)
Calcium: 9 mg/dL (ref 8.4–10.5)
Chloride: 102 mEq/L (ref 96–112)
Creatinine, Ser: 1.17 mg/dL (ref 0.40–1.50)
GFR: 64.16 mL/min (ref >60.00)
Glucose, Bld: 106 mg/dL — ABNORMAL HIGH (ref 70–99)
Potassium: 3.9 mEq/L (ref 3.5–5.1)
Sodium: 138 mEq/L (ref 135–145)

## 2019-03-10 LAB — CBC
HCT: 45.5 % (ref 39.0–52.0)
Hemoglobin: 15.3 g/dL (ref 13.0–17.0)
MCHC: 33.7 g/dL (ref 30.0–36.0)
MCV: 91.2 fl (ref 78.0–100.0)
Platelets: 232 10*3/uL (ref 150.0–400.0)
RBC: 4.99 Mil/uL (ref 4.22–5.81)
RDW: 12.2 % (ref 11.5–15.5)
WBC: 5.7 10*3/uL (ref 4.0–10.5)

## 2019-03-16 ENCOUNTER — Ambulatory Visit
Admission: RE | Admit: 2019-03-16 | Discharge: 2019-03-16 | Disposition: A | Discharge status: Home / Self Care | Payer: BC Managed Care – PPO | Source: Ambulatory Visit | Attending: Family Medicine | Admitting: Family Medicine

## 2019-03-16 ENCOUNTER — Encounter: Payer: Self-pay | Admitting: Family Medicine

## 2019-03-16 DIAGNOSIS — R1011 Right upper quadrant pain: Secondary | ICD-10-CM

## 2019-08-23 ENCOUNTER — Ambulatory Visit: Payer: BC Managed Care – PPO | Admitting: Nurse Practitioner

## 2019-11-07 NOTE — Progress Notes (Signed)
PCP:  Ronnald Nian, DO Primary Cardiologist: No primary care provider on file. Electrophysiologist: Thompson Grayer, MD   Larry Mathis is a 59 y.o. male seen today for Thompson Grayer, MD for routine electrophysiology followup.  Since last being seen in our clinic the patient reports doing well overall. He has had two episodes of atypical chest pain, both occurred about an hour after exercise. Last year was seen in [facility] ED, with negative troponins. This last episode occurred 6/20 and he did not seek treatment. This pain occurred as a soreness and discomfort in his left chest, about an hour after exercising. He had already showered and was laying down on the couch. He had mild soreness in his chest as well. No specific aggravating or relieving factors. He actually remains slightly sore today. He has been exercising and mowing the lawn with a push mower without any exacerbation of these symptoms. He denies palpitations, dyspnea, PND, orthopnea, nausea, vomiting, dizziness, syncope, edema, weight gain, or early satiety.  Past Medical History:  Diagnosis Date  . Anxiety   . Chest pain   . Fatigue   . Lightheadedness   . PAC (premature atrial contraction)   . Paroxysmal atrial fibrillation (HCC)   . SOB (shortness of breath)    Past Surgical History:  Procedure Laterality Date  . ATRIAL FIBRILLATION ABLATION N/A 07/24/2017   Procedure: ATRIAL FIBRILLATION ABLATION;  Surgeon: Thompson Grayer, MD;  Location: [facility];  Service: Cardiovascular;  Laterality: N/A;  . BACK SURGERY    . COLONOSCOPY    . HERNIA REPAIR      Current Outpatient Medications  Medication Sig Dispense Refill  . Multiple Vitamins-Minerals (MULTIVITAMIN PO) Take 1 tablet by mouth daily.    . Omega-3 Fatty Acids (FISH OIL) 1200 MG CAPS Take 1,200 mg by mouth daily.      No current facility-administered medications for this visit.    No Known Allergies  Social History   Socioeconomic History  . Marital  status: Married    Spouse name: Not on file  . Number of children: Not on file  . Years of education: Not on file  . Highest education level: Not on file  Occupational History  . Not on file  Tobacco Use  . Smoking status: Former Research scientist (life sciences)  . Smokeless tobacco: Former Systems developer    Types: Snuff, Sarina Ser    Quit date: 08/19/1986  Vaping Use  . Vaping Use: Never used  Substance and Sexual Activity  . Alcohol use: Yes  . Drug use: No  . Sexual activity: Not on file  Other Topics Concern  . Not on file  Social History Narrative  . Not on file   Social Determinants of Health   Financial Resource Strain:   . Difficulty of Paying Living Expenses:   Food Insecurity:   . Worried About Charity fundraiser in the Last Year:   . Arboriculturist in the Last Year:   Transportation Needs:   . Film/video editor (Medical):   Marland Kitchen Lack of Transportation (Non-Medical):   Physical Activity:   . Days of Exercise per Week:   . Minutes of Exercise per Session:   Stress:   . Feeling of Stress :   Social Connections:   . Frequency of Communication with Friends and Family:   . Frequency of Social Gatherings with Friends and Family:   . Attends Religious Services:   . Active Member of Clubs or Organizations:   .  Attends Archivist Meetings:   Marland Kitchen Marital Status:   Intimate Partner Violence:   . Fear of Current or Ex-Partner:   . Emotionally Abused:   Marland Kitchen Physically Abused:   . Sexually Abused:     Review of Systems: All other systems reviewed and are otherwise negative except as noted above.  Physical Exam: Vitals:   11/08/19 0910  BP: 130/90  Pulse: 70  SpO2: 100%  Weight: 209 lb (94.8 kg)  Height: 6\' 3"  (1.905 m)    GEN- The patient is well appearing, alert and oriented x 3 today.   HEENT: normocephalic, atraumatic; sclera clear, conjunctiva pink; hearing intact; oropharynx clear; neck supple, no JVP Lymph- no cervical lymphadenopathy Lungs- Clear to ausculation bilaterally,  normal work of breathing.  No wheezes, rales, rhonchi Heart- Regular rate and rhythm, no murmurs, rubs or gallops, PMI not laterally displaced GI- soft, non-tender, non-distended, bowel sounds present, no hepatosplenomegaly Extremities- no clubbing, cyanosis, or edema; DP/PT/radial pulses 2+ bilaterally MS- no significant deformity or atrophy Skin- warm and dry, no rash or lesion Psych- euthymic mood, full affect Neuro- strength and sensation are intact  EKG is ordered. Personal review of EKG from today shows NSR at 70 bpm with normal intervals  Additional studies reviewed include: Previous EP office notes, Normal echo 05/2017  Assessment and Plan:  1. Paroxysmal atrial fibrillation s/p ablation 06/2017 He is doing well post ablation off AADs He is not on [place], per guidelines, with CHA2DS2VASC of 0 at this time.     2. Atypical chest discomfort Only happened twice, about a year apart. 10/16/19 and last year in august. Both times it happened about an hour after exercise. Felt heart racing but NOT irregular, does not feel like his A fib. Mild chest discomfort left side and into left arm, but at rest. Not related to exertion.  We will update echo for completeness.   He has been exercising without issue since.  Very low suspicion for cardiac etiology. Troponins were negative when seen in ED 11/2018 for same.   Shirley Friar, PA-C  11/08/19 10:08 AM

## 2019-11-08 ENCOUNTER — Encounter: Payer: Self-pay | Admitting: Student

## 2019-11-08 ENCOUNTER — Ambulatory Visit: Payer: BC Managed Care – PPO | Admitting: Student

## 2019-11-08 ENCOUNTER — Other Ambulatory Visit: Payer: Self-pay

## 2019-11-08 VITALS — BP 130/90 | HR 70 | Ht 75.0 in | Wt 209.0 lb

## 2019-11-08 DIAGNOSIS — R0789 Other chest pain: Secondary | ICD-10-CM

## 2019-11-08 DIAGNOSIS — I480 Paroxysmal atrial fibrillation: Secondary | ICD-10-CM | POA: Diagnosis not present

## 2019-11-08 LAB — CBC WITH DIFFERENTIAL/PLATELET
Basophils Absolute: 0 10*3/uL (ref 0.0–0.2)
Basos: 1 %
EOS (ABSOLUTE): 0.1 10*3/uL (ref 0.0–0.4)
Eos: 2 %
Hematocrit: 46.2 % (ref 37.5–51.0)
Hemoglobin: 16.2 g/dL (ref 13.0–17.7)
Immature Grans (Abs): 0 10*3/uL (ref 0.0–0.1)
Immature Granulocytes: 0 %
Lymphocytes Absolute: 0.9 10*3/uL (ref 0.7–3.1)
Lymphs: 19 %
MCH: 31.2 pg (ref 26.6–33.0)
MCHC: 35.1 g/dL (ref 31.5–35.7)
MCV: 89 fL (ref 79–97)
Monocytes Absolute: 0.5 10*3/uL (ref 0.1–0.9)
Monocytes: 11 %
Neutrophils Absolute: 3.1 10*3/uL (ref 1.4–7.0)
Neutrophils: 67 %
Platelets: 224 10*3/uL (ref 150–450)
RBC: 5.19 x10E6/uL (ref 4.14–5.80)
RDW: 12 % (ref 11.6–15.4)
WBC: 4.7 10*3/uL (ref 3.4–10.8)

## 2019-11-08 LAB — BASIC METABOLIC PANEL
BUN/Creatinine Ratio: 12 (ref 9–20)
BUN: 13 mg/dL (ref 6–24)
CO2: 25 mmol/L (ref 20–29)
Calcium: 9.3 mg/dL (ref 8.7–10.2)
Chloride: 99 mmol/L (ref 96–106)
Creatinine, Ser: 1.12 mg/dL (ref 0.76–1.27)
GFR calc Af Amer: 83 mL/min/{1.73_m2} (ref >59)
GFR calc non Af Amer: 72 mL/min/{1.73_m2} (ref >59)
Glucose: 100 mg/dL — ABNORMAL HIGH (ref 65–99)
Potassium: 4.4 mmol/L (ref 3.5–5.2)
Sodium: 138 mmol/L (ref 134–144)

## 2019-11-08 NOTE — Patient Instructions (Signed)
Medication Instructions:  *If you need a refill on your cardiac medications before your next appointment, please call your pharmacy*  Lab Work: Your physician has recommended that you have lab work today: BMET and CBC If you have labs (blood work) drawn today and your tests are completely normal, you will receive your results only by: Marland Kitchen MyChart Message (if you have MyChart) OR . A paper copy in the mail If you have any lab test that is abnormal or we need to change your treatment, we will call you to review the results.  Procedures/Testing: Your physician has requested that you have an echocardiogram. Echocardiography is a painless test that uses sound waves to create images of your heart. It provides your doctor with information about the size and shape of your heart and how well your heart's chambers and valves are working. This procedure takes approximately one hour. There are no restrictions for this procedure.  Follow-Up: At [facility], you and your health needs are our priority.  As part of our continuing mission to provide you with exceptional heart care, we have created designated Provider Care Teams.  These Care Teams include your primary Cardiologist (physician) and Advanced Practice Providers (APPs -  Physician Assistants and Nurse Practitioners) who all work together to provide you with the care you need, when you need it.  We recommend signing up for the patient portal called "MyChart".  Sign up information is provided on this After Visit Summary.  MyChart is used to connect with patients for Virtual Visits (Telemedicine).  Patients are able to view lab/test results, encounter notes, upcoming appointments, etc.  Non-urgent messages can be sent to your provider as well.   To learn more about what you can do with MyChart, go to [email or web address].    Your next appointment:   Your physician wants you to follow-up in: 1 YEAR with Dr. Rayann Heman. You will receive a reminder letter in  the mail two months in advance. If you don't receive a letter, please call our office to schedule the follow-up appointment.  The format for your next appointment:   In Person with Thompson Grayer, MD

## 2019-11-09 NOTE — Addendum Note (Signed)
Addended by: Jeremy Johann on: 11/09/2019 03:57 PM   Modules accepted: Orders

## 2019-11-24 ENCOUNTER — Other Ambulatory Visit: Payer: Self-pay

## 2019-11-24 ENCOUNTER — Ambulatory Visit (HOSPITAL_COMMUNITY): Payer: BC Managed Care – PPO | Attending: Cardiovascular Disease

## 2019-11-24 DIAGNOSIS — I480 Paroxysmal atrial fibrillation: Secondary | ICD-10-CM | POA: Insufficient documentation

## 2019-11-24 LAB — ECHOCARDIOGRAM COMPLETE
Area-P 1/2: 2.42 cm2
S' Lateral: 3.7 cm

## 2020-12-21 ENCOUNTER — Other Ambulatory Visit: Payer: Self-pay

## 2020-12-21 ENCOUNTER — Emergency Department (HOSPITAL_COMMUNITY)
Admission: EM | Admit: 2020-12-21 | Discharge: 2020-12-22 | Disposition: A | Discharge status: Home / Self Care | Payer: BC Managed Care – PPO | Attending: Emergency Medicine | Admitting: Emergency Medicine

## 2020-12-21 ENCOUNTER — Encounter (HOSPITAL_COMMUNITY): Payer: Self-pay

## 2020-12-21 ENCOUNTER — Emergency Department (HOSPITAL_COMMUNITY): Payer: BC Managed Care – PPO

## 2020-12-21 DIAGNOSIS — R0789 Other chest pain: Secondary | ICD-10-CM | POA: Diagnosis not present

## 2020-12-21 DIAGNOSIS — R079 Chest pain, unspecified: Secondary | ICD-10-CM

## 2020-12-21 DIAGNOSIS — Z87891 Personal history of nicotine dependence: Secondary | ICD-10-CM | POA: Insufficient documentation

## 2020-12-21 LAB — TROPONIN I (HIGH SENSITIVITY): Troponin I (High Sensitivity): 4 ng/L (ref <18)

## 2020-12-21 LAB — BASIC METABOLIC PANEL
Anion gap: 7 (ref 5–15)
BUN: 15 mg/dL (ref 6–20)
CO2: 28 mmol/L (ref 22–32)
Calcium: 9.3 mg/dL (ref 8.9–10.3)
Chloride: 103 mmol/L (ref 98–111)
Creatinine, Ser: 1.18 mg/dL (ref 0.61–1.24)
GFR, Estimated: >60 mL/min (ref >60)
Glucose, Bld: 169 mg/dL — ABNORMAL HIGH (ref 70–99)
Potassium: 3.5 mmol/L (ref 3.5–5.1)
Sodium: 138 mmol/L (ref 135–145)

## 2020-12-21 LAB — CBC
HCT: 46 % (ref 39.0–52.0)
Hemoglobin: 15.6 g/dL (ref 13.0–17.0)
MCH: 30.7 pg (ref 26.0–34.0)
MCHC: 33.9 g/dL (ref 30.0–36.0)
MCV: 90.6 fL (ref 80.0–100.0)
Platelets: 236 10*3/uL (ref 150–400)
RBC: 5.08 MIL/uL (ref 4.22–5.81)
RDW: 11.7 % (ref 11.5–15.5)
WBC: 5 10*3/uL (ref 4.0–10.5)
nRBC: 0 % (ref 0.0–0.2)

## 2020-12-21 NOTE — ED Triage Notes (Addendum)
Left sided chest pain (intermittent) x this week. Denies nausea, vomiting, and shortness of breath.   Denies taking any BP meds.hx of ablation and afib.

## 2020-12-22 LAB — TROPONIN I (HIGH SENSITIVITY): Troponin I (High Sensitivity): 3 ng/L (ref <18)

## 2020-12-22 NOTE — ED Provider Notes (Signed)
[facility] EMERGENCY DEPARTMENT Provider Note   CSN: [id number] Arrival date & time: 12/21/20  2224     History Chief Complaint  Patient presents with   Chest Pain    Larry Mathis is a 60 y.o. male.  HPI 60 year old male presents for chest discomfort.  The patient states that a few times on and off this week he has had some discomfort and soreness to his chest.  Feels like he worked out with push-ups but has not.  Most recently felt this yesterday all day intermittently.  However tonight at around 9 PM he developed a sudden onset of discomfort in his chest that felt like his heart was beating rapidly for 5 or so beats.  He has had that before in the past with paroxysmal A. fib.  It is not there now.  Currently his chest feels fine.  No associated shortness of breath, diaphoresis, vomiting or abdominal/back pain.  Past Medical History:  Diagnosis Date   Anxiety    Chest pain    Fatigue    Lightheadedness    PAC (premature atrial contraction)    Paroxysmal atrial fibrillation (HCC)    SOB (shortness of breath)     Patient Active Problem List   Diagnosis Date Noted   Influenza vaccination declined by patient 02/12/2018   ABNORMAL STRESS ELECTROCARDIOGRAM 05/24/2009   PALPITATIONS 05/16/2009   CHEST PAIN 05/16/2009    Past Surgical History:  Procedure Laterality Date   ATRIAL FIBRILLATION ABLATION N/A 07/24/2017   Procedure: ATRIAL FIBRILLATION ABLATION;  Surgeon: Thompson Grayer, MD;  Location: [facility];  Service: Cardiovascular;  Laterality: N/A;   BACK SURGERY     COLONOSCOPY     HERNIA REPAIR         Family History  Problem Relation Age of Onset   Cancer Mother        breast   Cancer Father    [name] Parkinson White syndrome Father    Heart disease Brother    Heart murmur Brother    Colon cancer Neg Hx    Esophageal cancer Neg Hx    Rectal cancer Neg Hx    Stomach cancer Neg Hx     Social History   Tobacco Use   Smoking  status: Former   Smokeless tobacco: Former    Types: Snuff, Chew    Quit date: 08/19/1986  Vaping Use   Vaping Use: Never used  Substance Use Topics   Alcohol use: Yes   Drug use: No    Home Medications Prior to Admission medications   Medication Sig Start Date End Date Taking? Authorizing Provider  Multiple Vitamins-Minerals (MULTIVITAMIN PO) Take 1 tablet by mouth daily.    [provider]  Omega-3 Fatty Acids (FISH OIL) 1200 MG CAPS Take 1,200 mg by mouth daily.     [provider]    Allergies    Patient has no known allergies.  Review of Systems   Review of Systems  Constitutional:  Negative for diaphoresis.  Respiratory:  Negative for shortness of breath.   Cardiovascular:  Positive for chest pain and palpitations.  Gastrointestinal:  Negative for abdominal pain and vomiting.  Musculoskeletal:  Negative for back pain.  All other systems reviewed and are negative.  Physical Exam Updated Vital Signs BP 133/82   Pulse 77   Temp 98.1 F (36.7 C) (Oral)   Resp 20   Ht '6\' 3"'$  (1.905 m)   Wt 94.8 kg   SpO2  97%   BMI 26.12 kg/m   Physical Exam Vitals and nursing note reviewed.  Constitutional:      Appearance: He is well-developed.  HENT:     Head: Normocephalic and atraumatic.     Right Ear: External ear normal.     Left Ear: External ear normal.     Nose: Nose normal.  Eyes:     General:        Right eye: No discharge.        Left eye: No discharge.  Cardiovascular:     Rate and Rhythm: Normal rate and regular rhythm.     Heart sounds: Normal heart sounds.  Pulmonary:     Effort: Pulmonary effort is normal.     Breath sounds: Normal breath sounds.  Abdominal:     Palpations: Abdomen is soft.     Tenderness: There is no abdominal tenderness.  Musculoskeletal:     Cervical back: Neck supple.  Skin:    General: Skin is warm and dry.  Neurological:     Mental Status: He is alert.  Psychiatric:        Mood and Affect: Mood is not  anxious.    ED Results / Procedures / Treatments   Labs (all labs ordered are listed, but only abnormal results are displayed) Labs Reviewed  BASIC METABOLIC PANEL - Abnormal; Notable for the following components:      Result Value   Glucose, Bld 169 (*)    All other components within normal limits  CBC  TROPONIN I (HIGH SENSITIVITY)  TROPONIN I (HIGH SENSITIVITY)    EKG EKG Interpretation  Date/Time:  Saturday December 22 2020 00:26:43 EDT Ventricular Rate:  75 PR Interval:  126 QRS Duration: 100 QT Interval:  385 QTC Calculation: 430 R Axis:   84 Text Interpretation: Sinus rhythm Probable inferior infarct, old no acute ST/T changes similar to 2019 Confirmed by Sherwood Gambler [phone]) on 12/22/2020 12:33:39 AM  Radiology DG Chest 2 View  Result Date: 12/21/2020 CLINICAL DATA:  Left-sided chest pain. EXAM: CHEST - 2 VIEW COMPARISON:  April 17, 2017 FINDINGS: The heart size and mediastinal contours are within normal limits. Both lungs are clear. The visualized skeletal structures are unremarkable. IMPRESSION: No active cardiopulmonary disease. Electronically Signed   By: Virgina Norfolk M.D.   On: 12/21/2020 23:09    Procedures Procedures   Medications Ordered in ED Medications - No data to display  ED Course  I have reviewed the triage vital signs and the nursing notes.  Pertinent labs & imaging results that were available during my care of the patient were reviewed by me and considered in my medical decision making (see chart for details).    MDM Rules/Calculators/A&P                           Patient with quite atypical chest pain.  Seems more like some mild palpitations.  Troponins are negative x2 and his ECGs are benign.  Perhaps he was transiently in some A. fib given his prior history.  At this point however he shows no acute signs or symptoms and he appears stable for discharge home and can follow-up with cardiology as an outpatient.  We discussed return  precautions.  Highly doubt ACS, PE, dissection. Final Clinical Impression(s) / ED Diagnoses Final diagnoses:  Nonspecific chest pain    Rx / DC Orders ED Discharge Orders     None  Sherwood Gambler, MD 12/22/20 [phone]

## 2020-12-22 NOTE — Discharge Instructions (Addendum)
If you develop recurrent, continued, or worsening chest pain, shortness of breath, fever, vomiting, abdominal or back pain, or any other new/concerning symptoms then return to the ER for evaluation.  

## 2021-01-10 ENCOUNTER — Encounter (HOSPITAL_BASED_OUTPATIENT_CLINIC_OR_DEPARTMENT_OTHER): Payer: Self-pay | Admitting: Internal Medicine

## 2021-01-10 ENCOUNTER — Other Ambulatory Visit: Payer: Self-pay

## 2021-01-10 ENCOUNTER — Ambulatory Visit (HOSPITAL_BASED_OUTPATIENT_CLINIC_OR_DEPARTMENT_OTHER): Payer: BC Managed Care – PPO | Admitting: Internal Medicine

## 2021-01-10 ENCOUNTER — Ambulatory Visit (INDEPENDENT_AMBULATORY_CARE_PROVIDER_SITE_OTHER): Payer: BC Managed Care – PPO

## 2021-01-10 ENCOUNTER — Encounter: Payer: Self-pay | Admitting: *Deleted

## 2021-01-10 VITALS — BP 128/80 | HR 89 | Ht 75.0 in | Wt 205.0 lb

## 2021-01-10 DIAGNOSIS — I480 Paroxysmal atrial fibrillation: Secondary | ICD-10-CM

## 2021-01-10 DIAGNOSIS — R0789 Other chest pain: Secondary | ICD-10-CM

## 2021-01-10 DIAGNOSIS — I483 Typical atrial flutter: Secondary | ICD-10-CM | POA: Diagnosis not present

## 2021-01-10 NOTE — Patient Instructions (Addendum)
Medication Instructions:  Your physician recommends that you continue on your current medications as directed. Please refer to the Current Medication list given to you today.  Labwork: None ordered.  Testing/Procedures: Your physician has requested that you have en exercise stress myoview. For further information please visit [email or web address]. Please follow instruction sheet, as given.   Your physician has recommended that you wear a Heart monitor. Heart monitors are medical devices that record the heart's electrical activity. Doctors most often use these monitors to diagnose arrhythmias. Arrhythmias are problems with the speed or rhythm of the heartbeat. The monitor is a small, portable device. You can wear one while you do your normal daily activities. This is usually used to diagnose what is causing palpitations/syncope (passing out).   Follow-Up: Your physician wants you to follow-up in: 6 weeks with  one of the following Advanced Practice Providers on your designated Care Team:     Laurann Montana     Any Other Special Instructions Will Be Listed Below (If Applicable).  If you need a refill on your cardiac medications before your next appointment, please call your pharmacy.   [employer]Cor  FDA-cleared EKG at your fingertips. - [employer]   Agricultural engineer, [employer]. [email or web address]://[email or web address].alivecor.[email or web address]   FDA-cleared, clinical grade mobile EKG monitor: Jodelle Red is the most clinically-validated mobile EKG used by the world's leading cardiac care medical professionals.  This may be useful in monitoring palpitations.  We do not have access to have them emailed and reviewed but will be glad to review while in the office.     ZIO XT- Long Term Monitor Instructions  Your physician has requested you wear a ZIO patch monitor for 14 days.  This is a single patch monitor. [employer] supplies one patch monitor per enrollment. Additional stickers are not available. Please do  not apply patch if you will be having a Nuclear Stress Test,  Echocardiogram, Cardiac CT, MRI, or Chest Xray during the period you would be wearing the  monitor. The patch cannot be worn during these tests. You cannot remove and re-apply the  ZIO XT patch monitor.  Your ZIO patch monitor will be mailed 3 day USPS to your address on file. It may take 3-5 days  to receive your monitor after you have been enrolled.  Once you have received your monitor, please review the enclosed instructions. Your monitor  has already been registered assigning a specific monitor serial # to you.  Billing and Patient Assistance Program Information  We have supplied [employer] with any of your insurance information on file for billing purposes. [employer] offers a sliding scale Patient Assistance Program for patients that do not have  insurance, or whose insurance does not completely cover the cost of the ZIO monitor.  You must apply for the Patient Assistance Program to qualify for this discounted rate.  To apply, please call [employer] at [phone], select option 4, select option 2, ask to apply for  Patient Assistance Program. Theodore Demark will ask your household income, and how many people  are in your household. They will quote your out-of-pocket cost based on that information.  [employer] will also be able to set up a 20-month interest-free payment plan if needed.  Applying the monitor   Shave hair from upper left chest.  Hold abrader disc by orange tab. Rub abrader in 40 strokes over the upper left chest as  indicated in your monitor instructions.  Clean area with 4 enclosed alcohol pads. Let dry.  Apply patch as indicated in  monitor instructions. Patch will be placed under collarbone on left  side of chest with arrow pointing upward.  Rub patch adhesive wings for 2 minutes. Remove white label marked "1". Remove the white  label marked "2". Rub patch adhesive wings for 2 additional minutes.  While looking in a  mirror, press and release button in center of patch. A small green light will  flash 3-4 times. This will be your only indicator that the monitor has been turned on.  Do not shower for the first 24 hours. You may shower after the first 24 hours.  Press the button if you feel a symptom. You will hear a small click. Record Date, Time and  Symptom in the Patient Logbook.  When you are ready to remove the patch, follow instructions on the last 2 pages of Patient  Logbook. Stick patch monitor onto the last page of Patient Logbook.  Place Patient Logbook in the blue and white box. Use locking tab on box and tape box closed  securely. The blue and white box has prepaid postage on it. Please place it in the mailbox as  soon as possible. Your physician should have your test results approximately 7 days after the  monitor has been mailed back to [facility].  Call [place] at [phone] if you have questions regarding  your ZIO XT patch monitor. Call them immediately if you see an orange light blinking on your  monitor.  If your monitor falls off in less than 4 days, contact our Monitor department at [phone].  If your monitor becomes loose or falls off after 4 days call [employer] at [phone] for  suggestions on securing your monitor

## 2021-01-10 NOTE — Progress Notes (Addendum)
    PCP: No primary care provider on file.   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 male who presents today for routine electrophysiology followup.  Since last being seen in our clinic, the patient reports doing reasoanbly well.  He has a h/o atypical chest pain.  Recently, he reports several episodes over the past month.  Episodes occur at rest and are associated with anxiety.  He denies exertional symptoms.  Today, he denies symptoms of palpitations, shortness of breath,  lower extremity edema, dizziness, presyncope, or syncope.  The patient is otherwise without complaint today.   Past Medical History:  Diagnosis Date   Anxiety    Chest pain    Fatigue    Lightheadedness    PAC (premature atrial contraction)    Paroxysmal atrial fibrillation (HCC)    SOB (shortness of breath)    Past Surgical History:  Procedure Laterality Date   ATRIAL FIBRILLATION ABLATION N/A 07/24/2017   Procedure: ATRIAL FIBRILLATION ABLATION;  Surgeon: Thompson Grayer, MD;  Location: [facility];  Service: Cardiovascular;  Laterality: N/A;   BACK SURGERY     COLONOSCOPY     HERNIA REPAIR      ROS- all systems are reviewed and negatives except as per HPI above  Current Outpatient Medications  Medication Sig Dispense Refill   Multiple Vitamins-Minerals (MULTIVITAMIN PO) Take 1 tablet by mouth daily.     Omega-3 Fatty Acids (FISH OIL) 1200 MG CAPS Take 1,200 mg by mouth daily.      No current facility-administered medications for this visit.    Physical Exam: Vitals:   01/10/21 1457  BP: 128/80  Pulse: 89  SpO2: 98%  Weight: 205 lb (93 kg)  Height: '6\' 3"'$  (1.905 m)    GEN- The patient is well appearing, alert and oriented x 3 today.   Head- normocephalic, atraumatic Eyes-  Sclera clear, conjunctiva pink Ears- hearing intact Oropharynx- clear Lungs- Clear to ausculation bilaterally, normal work of breathing Heart- Regular rate and rhythm, no murmurs, rubs or gallops, PMI not  laterally displaced GI- soft, NT, ND, + BS Extremities- no clubbing, cyanosis, or edema  Wt Readings from Last 3 Encounters:  01/10/21 205 lb (93 kg)  12/21/20 208 lb 15.9 oz (94.8 kg)  11/08/19 209 lb (94.8 kg)    EKG tracing ordered today is personally reviewed and shows sinus rhythm, short PR, no ischemic changes  Assessment and Plan:  Afib Well controlled post ablation Chads2vasc score is 0.  Does not require Fielding  2. Chest pain Mostly atypical Will obtain exercise myoview Place 14 day zio to exclude arrhythmia as cause I suspect, most likely related to anxiety  Return in 2 months to follow-up with cardiology APP regarding myoview and then annually  Thompson Grayer MD, [facility] 01/10/2021 3:06 PM

## 2021-01-10 NOTE — Progress Notes (Unsigned)
Patient enrolled for [employer] to mail a 14 day ZIO XT monitor to his [address] address on file.

## 2021-01-14 ENCOUNTER — Telehealth (HOSPITAL_COMMUNITY): Payer: Self-pay

## 2021-01-14 NOTE — Telephone Encounter (Signed)
Spoke with the patient, detailed instructions were given. He stated that he would be here for his test. Asked to call back with any questions. [name] EMTP 

## 2021-01-15 ENCOUNTER — Other Ambulatory Visit: Payer: Self-pay

## 2021-01-15 ENCOUNTER — Ambulatory Visit (HOSPITAL_COMMUNITY): Payer: BC Managed Care – PPO | Attending: Internal Medicine

## 2021-01-15 DIAGNOSIS — R0789 Other chest pain: Secondary | ICD-10-CM | POA: Diagnosis not present

## 2021-01-15 LAB — MYOCARDIAL PERFUSION IMAGING
Estimated workload: 9.2
Exercise duration (min): 7 min
LV dias vol: 96 mL (ref 62–150)
LV sys vol: 37 mL
MPHR: 161 {beats}/min
Nuc Stress EF: 61 %
Peak HR: 157 {beats}/min
Percent HR: 97 %
RPE: 18
Rest HR: 78 {beats}/min
Rest Nuclear Isotope Dose: 10.8 mCi
SDS: 0
SRS: 0
SSS: 0
ST Depression (mm): 0 mm
Stress Nuclear Isotope Dose: 32.9 mCi
TID: 0.97

## 2021-01-15 MED ORDER — TECHNETIUM TC 99M TETROFOSMIN IV KIT
10.8000 | PACK | Freq: Once | INTRAVENOUS | Status: AC | PRN
  Administered 2021-01-15: 10.8 via INTRAVENOUS
  Filled 2021-01-15: qty 11

## 2021-01-15 MED ORDER — TECHNETIUM TC 99M TETROFOSMIN IV KIT
10.8000 | PACK | Freq: Once | INTRAVENOUS | Status: AC | PRN
  Filled 2021-01-15: qty 11

## 2021-01-15 MED ORDER — TECHNETIUM TC 99M TETROFOSMIN IV KIT
32.9000 | PACK | Freq: Once | INTRAVENOUS | Status: AC | PRN
  Administered 2021-01-15: 32.9 via INTRAVENOUS
  Filled 2021-01-15: qty 33

## 2021-01-20 DIAGNOSIS — R0789 Other chest pain: Secondary | ICD-10-CM | POA: Diagnosis not present

## 2021-01-20 DIAGNOSIS — I480 Paroxysmal atrial fibrillation: Secondary | ICD-10-CM

## 2021-01-20 DIAGNOSIS — I483 Typical atrial flutter: Secondary | ICD-10-CM | POA: Diagnosis not present

## 2021-02-21 ENCOUNTER — Other Ambulatory Visit: Payer: Self-pay

## 2021-02-21 ENCOUNTER — Ambulatory Visit (HOSPITAL_BASED_OUTPATIENT_CLINIC_OR_DEPARTMENT_OTHER): Payer: BC Managed Care – PPO | Admitting: Family

## 2021-02-21 ENCOUNTER — Encounter (HOSPITAL_BASED_OUTPATIENT_CLINIC_OR_DEPARTMENT_OTHER): Payer: Self-pay | Admitting: Family

## 2021-02-21 VITALS — BP 130/80 | HR 87 | Ht 75.0 in | Wt 210.6 lb

## 2021-02-21 DIAGNOSIS — Z9889 Other specified postprocedural states: Secondary | ICD-10-CM

## 2021-02-21 DIAGNOSIS — Z8679 Personal history of other diseases of the circulatory system: Secondary | ICD-10-CM | POA: Diagnosis not present

## 2021-02-21 DIAGNOSIS — I480 Paroxysmal atrial fibrillation: Secondary | ICD-10-CM

## 2021-02-21 DIAGNOSIS — R002 Palpitations: Secondary | ICD-10-CM

## 2021-02-21 NOTE — Progress Notes (Signed)
Office Visit    Patient Name: Larry Mathis Date of Encounter: 02/21/2021  PCP:  Pcp, No   [place]  Cardiologist:  None  Advanced Practice Provider:  No care team member to display Electrophysiologist:  Thompson Grayer, MD   Chief Complaint    Larry Mathis is a 60 y.o. male with a hx of anxiety, PAF, PAC, dyspnea presents today for follow up after monitor   Past Medical History    Past Medical History:  Diagnosis Date   Anxiety    Chest pain    Fatigue    Lightheadedness    PAC (premature atrial contraction)    Paroxysmal atrial fibrillation (HCC)    SOB (shortness of breath)    Past Surgical History:  Procedure Laterality Date   ATRIAL FIBRILLATION ABLATION N/A 07/24/2017   Procedure: ATRIAL FIBRILLATION ABLATION;  Surgeon: Thompson Grayer, MD;  Location: [facility];  Service: Cardiovascular;  Laterality: N/A;   BACK SURGERY     COLONOSCOPY     HERNIA REPAIR      Allergies  No Known Allergies  History of Present Illness    Larry Mathis is a 60 y.o. male with a hx of anxiety, PAF, PAC, dyspnea  last seen 01/10/21 by Dr. Rayann Heman.  He had previous atrial fibrillation ablation with Dr. Rayann Heman March 2019.  He has not been maintained on anticoagulation as his CHA2DS2-VASc score is 0.  He was seen 01/13/2021 noting atypical chest pain in setting of anxiety that occurred at rest.  He was recommended for Myoview and monitor.  Lexiscan Myoview 01/15/2021 was normal study with no evidence of ischemia nor prior ischemia.  LVEF was normal 61%.  Preliminary report of monitor revealed predominantly normal sinus rhythm with 4 episodes of SVT with fastest 8 beats at a rate of 203 bpm which were asymptomatic.  He presents today for follow-up.  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  Endorses that his job is very active.  He does endorse recent stress related to his ex-wife as well as purchasing property to build a home.  Tells me his symptoms occur with  stress.  We reviewed his monitor report as well as Myoview and he was reassured by the results.  He did have an episode of anxiety and chest pain October 16th  but notes his chest wall was tender on palpation we reviewed that this is likely musculoskeletal.  He took ibuprofen with relief of pain.  He has a pending appointment in January to establish with primary care and plans to discuss anxiolytic as he has taken in the past and benefited.  Reports no exertional chest pain, dyspnea.  No palpitations, lightheadedness, dizziness, near-syncope, syncope.  EKGs/Labs/Other Studies Reviewed:   The following studies were reviewed today:  Monitor 02/05/21 Preliminary report Patient had a min HR of 49 bpm, max HR of 203 bpm, and avg HR of 82 bpm. Predominant underlying rhythm was Sinus Rhythm. 4 Supraventricular Tachycardia runs occurred, the run with the fastest interval lasting 8 beats with a max rate of 203 bpm, the  longest lasting 7 beats with an avg rate of 110 bpm. Isolated SVEs were rare (<1.0%), SVE Couplets were rare (<1.0%), and SVE Triplets were rare (<1.0%). Isolated VEs were rare (<1.0%), VE Couplets were rare (<1.0%), and no VE Triplets were present.   Myoview 01/15/21   The study is normal. Findings are consistent with no prior ischemia. The study is low risk.  No ST deviation was noted.   LV perfusion is normal. There is no evidence of ischemia. There is no evidence of infarction.   Left ventricular function is normal. Nuclear stress EF: 61 %. The left ventricular ejection fraction is normal (55-65%). End diastolic cavity size is normal. End systolic cavity size is normal.   Prior study available for comparison from 05/22/2009. Unable to view prior study.   Negative stress test with no evidence of ischemia.   EKG:   No EKG today  Recent Labs: 12/21/2020: BUN 15; Creatinine, Ser 1.18; Hemoglobin 15.6; Platelets 236; Potassium 3.5; Sodium 138  Recent Lipid Panel    Component Value  Date/Time   CHOL 176 02/16/2018 0802   TRIG 58.0 02/16/2018 0802   HDL 37.90 (L) 02/16/2018 0802   CHOLHDL 5 02/16/2018 0802   VLDL 11.6 02/16/2018 0802   LDLCALC 126 (H) 02/16/2018 0802    Home Medications   Current Meds  Medication Sig   Multiple Vitamins-Minerals (MULTIVITAMIN PO) Take 1 tablet by mouth daily.   Omega-3 Fatty Acids (FISH OIL) 1200 MG CAPS Take 1,200 mg by mouth daily.      Review of Systems      All other systems reviewed and are otherwise negative except as noted above.  Physical Exam    VS:  BP 130/80 (BP Location: Right Arm, Patient Position: Sitting, Cuff Size: Large)   Pulse 87   Ht 6\' 3"  (1.905 m)   Wt 210 lb 9.6 oz (95.5 kg)   SpO2 99%   BMI 26.32 kg/m  , BMI Body mass index is 26.32 kg/m.  Wt Readings from Last 3 Encounters:  02/21/21 210 lb 9.6 oz (95.5 kg)  01/15/21 205 lb (93 kg)  01/10/21 205 lb (93 kg)    GEN: Well nourished, well developed, in no acute distress. HEENT: normal. Neck: Supple, no JVD, carotid bruits, or masses. Cardiac: RRR, no murmurs, rubs, or gallops. No clubbing, cyanosis, edema.  Radials/PT 2+ and equal bilaterally.  Respiratory:  Respirations regular and unlabored, clear to auscultation bilaterally. GI: Soft, nontender, nondistended. MS: No deformity or atrophy. Skin: Warm and dry, no rash. Neuro:  Strength and sensation are intact. Psych: Normal affect.  Assessment & Plan    PAF s/p ablation -no recurrent atrial fibrillation by preliminary report of ZIO monitor.  Did show 4 episodes of NSVT which were asymptomatic.  Anticipate most of his palpitations are related to anxiety and he has upcoming appointment to establish primary care.  No indication for further cardiac work-up at this time.  Chest pain -Myoview 01/15/2021 low risk study with no evidence of ischemia.  Chest pain in the setting of anxiety.  He has upcoming appointment establish with primary care.  Anticipate he would benefit from anxiolytic.  Offered  referral to Dr. Elias Else, PsyD and he tells me he will think about it.  He will contact our office if he is interested.  Disposition: Follow up  12/2021  with Dr. Rayann Heman or APP.  Signed, Loel Dubonnet, NP 02/21/2021, 5:30 PM [place]

## 2021-02-21 NOTE — Patient Instructions (Signed)
Medication Instructions:  None ordered today.   *If you need a refill on your cardiac medications before your next appointment, please call your pharmacy*  Lab Work: None ordered today.   Testing/Procedures: Your stress test was low risk and did not show any blockages.   Your monitor showed mostly normal sinus rhythm. You had 4 very short episodes of a heart rhythm called SVT (fast heart beat in the top chambers of the heart). These were short and asymptomatic and not of concern. There was no evidence of atrial fibrillation. This is a good result!  Follow-Up: At [facility], you and your health needs are our priority.  As part of our continuing mission to provide you with exceptional heart care, we have created designated Provider Care Teams.  These Care Teams include your primary Cardiologist (physician) and Advanced Practice Providers (APPs -  Physician Assistants and Nurse Practitioners) who all work together to provide you with the care you need, when you need it.  We recommend signing up for the patient portal called "MyChart".  Sign up information is provided on this After Visit Summary.  MyChart is used to connect with patients for Virtual Visits (Telemedicine).  Patients are able to view lab/test results, encounter notes, upcoming appointments, etc.  Non-urgent messages can be sent to your provider as well.   To learn more about what you can do with MyChart, go to [email or web address].    Your next appointment:   September  2023  The format for your next appointment:   In Person  Provider:   Thompson Grayer, MD   Other Instructions  Pursed Lip Breathing  How to perform pursed lip breathing Being short of breath can make you tense and anxious. Before you start this breathing exercise, take a minute to relax your shoulders and close your eyes. Then: Start the exercise by closing your mouth. Breathe in through your nose, taking a normal breath. You can do this at your  normal rate of breathing. If you feel you are not getting enough air, breathe in while slowly counting to 2 or 3. Pucker (purse) your lips as if you were going to whistle. Gently tighten the muscles of your abdomenor press on your abdomen to help push the air out. Breathe out slowly through your pursed lips. Take at least twice as long to breathe out as it takes you to breathe in. Make sure that you breathe out all of the air, but do not force air out. Ask your health care provider how often and how long to do this exercise.  Managing Anxiety, Adult After being diagnosed with an anxiety disorder, you may be relieved to know why you have felt or behaved a certain way. You may also feel overwhelmed about the treatment ahead and what it will mean for your life. With care and support, you can manage this condition and recover from it. How to manage lifestyle changes Managing stress and anxiety Stress is your body's reaction to life changes and events, both good and bad. Most stress will last just a few hours, but stress can be ongoing and can lead to more than just stress. Although stress can play a major role in anxiety, it is not the same as anxiety. Stress is usually caused by something external, such as a deadline, test, or competition. Stress normally passes after the triggering event has ended.  Anxiety is caused by something internal, such as imagining a terrible outcome or worrying that something will go wrong that  will devastate you. Anxiety often does not go away even after the triggering event is over, and it can become long-term (chronic) worry. It is important to understand the differences between stress and anxiety and to manage your stress effectively so that it does not lead to an anxious response. Talk with your health care provider or a counselor to learn more about reducing anxiety and stress. He or she may suggest tension reduction techniques, such as: Music therapy. This can include  creating or listening to music that you enjoy and that inspires you. Mindfulness-based meditation. This involves being aware of your normal breaths while not trying to control your breathing. It can be done while sitting or walking. Centering prayer. This involves focusing on a word, phrase, or sacred image that means something to you and brings you peace. Deep breathing. To do this, expand your stomach and inhale slowly through your nose. Hold your breath for 3-5 seconds. Then exhale slowly, letting your stomach muscles relax. Self-talk. This involves identifying thought patterns that lead to anxiety reactions and changing those patterns. Muscle relaxation. This involves tensing muscles and then relaxing them. Choose a tension reduction technique that suits your lifestyle and personality. These techniques take time and practice. Set aside 5-15 minutes a day to do them. Therapists can offer counseling and training in these techniques. The training to help with anxiety may be covered by some insurance plans. Other things you can do to manage stress and anxiety include: Keeping a stress/anxiety diary. This can help you learn what triggers your reaction and then learn ways to manage your response. Thinking about how you react to certain situations. You may not be able to control everything, but you can control your response. Making time for activities that help you relax and not feeling guilty about spending your time in this way. Visual imagery and yoga can help you stay calm and relax.  Medicines Medicines can help ease symptoms. Medicines for anxiety include: Anti-anxiety drugs. Antidepressants. Medicines are often used as a primary treatment for anxiety disorder. Medicines will be prescribed by a health care provider. When used together, medicines, psychotherapy, and tension reduction techniques may be the most effective treatment. Relationships Relationships can play a big part in helping you  recover. Try to spend more time connecting with trusted friends and family members. Consider going to couples counseling, taking family education classes, or going to family therapy. Therapy can help you and others better understand your condition. How to recognize changes in your anxiety Everyone responds differently to treatment for anxiety. Recovery from anxiety happens when symptoms decrease and stop interfering with your daily activities at home or work. This may mean that you will start to: Have better concentration and focus. Worry will interfere less in your daily thinking. Sleep better. Be less irritable. Have more energy. Have improved memory. It is important to recognize when your condition is getting worse. Contact your health care provider if your symptoms interfere with home or work and you feel like your condition is not improving. Follow these instructions at home: Activity Exercise. Most adults should do the following: Exercise for at least 150 minutes each week. The exercise should increase your heart rate and make you sweat (moderate-intensity exercise). Strengthening exercises at least twice a week. Get the right amount and quality of sleep. Most adults need 7-9 hours of sleep each night. Lifestyle  Eat a healthy diet that includes plenty of vegetables, fruits, whole grains, low-fat dairy products, and lean protein. Do not  eat a lot of foods that are high in solid fats, added sugars, or salt. Make choices that simplify your life. Do not use any products that contain nicotine or tobacco, such as cigarettes, e-cigarettes, and chewing tobacco. If you need help quitting, ask your health care provider. Avoid caffeine, alcohol, and certain over-the-counter cold medicines. These may make you feel worse. Ask your pharmacist which medicines to avoid. General instructions Take over-the-counter and prescription medicines only as told by your health care provider. Keep all follow-up  visits as told by your health care provider. This is important. Where to find support You can get help and support from these sources: Self-help groups. Online and [employer]. A trusted spiritual leader. Couples counseling. Family education classes. Family therapy. Where to find more information You may find that joining a support group helps you deal with your anxiety. The following sources can help you locate counselors or support groups near you: [place]: www.mentalhealthamerica.net Anxiety and Depression Association of [place] (ADAA): https://www.clark.net/ National Alliance on Mental Illness (NAMI): www.nami.org Contact a health care provider if you: Have a hard time staying focused or finishing daily tasks. Spend many hours a day feeling worried about everyday life. Become exhausted by worry. Start to have headaches, feel tense, or have nausea. Urinate more than normal. Have diarrhea. Get help right away if you have: A racing heart and shortness of breath. Thoughts of hurting yourself or others. If you ever feel like you may hurt yourself or others, or have thoughts about taking your own life, get help right away. You can go to your nearest emergency department or call: Your local emergency services (911 in the [place] A suicide crisis helpline, such as the [facility] at (510)493-3392. This is open 24 hours a day. Summary Taking steps to learn and use tension reduction techniques can help calm you and help prevent triggering an anxiety reaction. When used together, medicines, psychotherapy, and tension reduction techniques may be the most effective treatment. Family, friends, and partners can play a big part in helping you recover from an anxiety disorder. This information is not intended to replace advice given to you by your health care provider. Make sure you discuss any questions you have with your health care provider. Document  Revised: 09/14/2018 Document Reviewed: 09/14/2018 Elsevier Patient Education  [place].

## 2021-05-14 ENCOUNTER — Other Ambulatory Visit: Payer: Self-pay

## 2021-05-15 ENCOUNTER — Encounter: Payer: Self-pay | Admitting: Family Medicine

## 2021-05-15 ENCOUNTER — Ambulatory Visit: Payer: BC Managed Care – PPO | Admitting: Family Medicine

## 2021-05-15 VITALS — BP 117/70 | HR 91 | Temp 97.8°F | Ht 75.0 in | Wt 203.4 lb

## 2021-05-15 DIAGNOSIS — Z131 Encounter for screening for diabetes mellitus: Secondary | ICD-10-CM

## 2021-05-15 DIAGNOSIS — Z13220 Encounter for screening for lipoid disorders: Secondary | ICD-10-CM

## 2021-05-15 DIAGNOSIS — I480 Paroxysmal atrial fibrillation: Secondary | ICD-10-CM | POA: Diagnosis not present

## 2021-05-15 DIAGNOSIS — Z9889 Other specified postprocedural states: Secondary | ICD-10-CM | POA: Diagnosis not present

## 2021-05-15 DIAGNOSIS — Z8679 Personal history of other diseases of the circulatory system: Secondary | ICD-10-CM

## 2021-05-15 DIAGNOSIS — I471 Supraventricular tachycardia: Secondary | ICD-10-CM | POA: Diagnosis not present

## 2021-05-15 DIAGNOSIS — Z125 Encounter for screening for malignant neoplasm of prostate: Secondary | ICD-10-CM

## 2021-05-15 DIAGNOSIS — Z808 Family history of malignant neoplasm of other organs or systems: Secondary | ICD-10-CM

## 2021-05-15 NOTE — Progress Notes (Signed)
[facility] [address] [address] Dept: [phone] Dept Fax: [phone]  New Patient Office Visit  Subjective:    Patient ID: Larry Mathis, male    DOB: 1961/01/16, 61 y.o..   MRN: [id number]  Chief Complaint  Patient presents with   Establish Care    [facility]- establish care.  C/o having anxiety attacks.   Declines flu and covid vaccines      History of Present Illness:  Patient is in today to establish care. Larry Mathis was born in [place], [place]. 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 He was stationed for 1 year in [place] and the rest at [employer] After he separated, he returned to [place]. 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 He plans to retire in about 5 years. Larry Mathis is divorced. He has no children. He stopped tobacco use about 8 years ago. He drinks rarely and denies drug use.  Larry Mathis has a history of paroxysmal atrial fibrillation. This was resolved with a cardiac ablation and he no longer has need for medication or anticoagulation (CHA2DS2-VASc= 0). Larry Mathis had been having some palpitations last Fall. He had a [employer] which showed some rare episodes of PSVT, the longest beign for 8 min.  Past Medical History: Patient Active Problem List   Diagnosis Date Noted   Paroxysmal atrial fibrillation ([place]) 05/15/2021   Status post ablation of atrial fibrillation 05/15/2021   Family history of malignant melanoma 05/15/2021   PSVT (paroxysmal supraventricular tachycardia) ([place]) 05/16/2009   Past Surgical History:  Procedure Laterality Date   ATRIAL FIBRILLATION ABLATION N/A 07/24/2017   Procedure: ATRIAL FIBRILLATION ABLATION;  Surgeon: Thompson Grayer, MD;  Location: [facility];  Service: Cardiovascular;  Laterality:  N/A;   COLONOSCOPY     INGUINAL HERNIA REPAIR Right    LUMBAR DISC SURGERY     L4-5   Family History  Problem Relation Age of Onset   Arthritis Mother    Cancer Mother        breast   Cancer Father        Melanoma   Larry Mathis Parkinson White syndrome Father    Cancer Brother        Melanoma   Heart disease Brother    Heart murmur Brother    Atrial fibrillation Brother    Alzheimer's disease Maternal Aunt    Cancer Paternal Aunt        Brain   Cancer Paternal Uncle        Bladder   Alzheimer's disease Maternal Grandmother    Cancer Paternal Grandmother    Cancer Paternal Grandfather        Bladder   Colon cancer Neg Hx    Esophageal cancer Neg Hx    Rectal cancer Neg Hx    Stomach cancer Neg Hx    Outpatient Medications Prior to Visit  Medication Sig Dispense Refill   Multiple Vitamins-Minerals (MULTIVITAMIN PO) Take 1 tablet by mouth daily.     Omega-3 Fatty Acids (FISH OIL) 1200 MG CAPS Take 1,200 mg by mouth daily.      Facility-Administered Medications Prior to Visit  Medication Dose Route Frequency Provider Last Rate Last Admin   technetium tetrofosmin (TC-MYOVIEW) injection 50.3 millicurie  54.6 millicurie Intravenous Once PRN Skeet Latch, MD  No Known Allergies    Objective:   Today's Vitals   05/15/21 1304  BP: 117/70  Pulse: 91  Temp: 97.8 F (36.6 C)  TempSrc: Temporal  SpO2: 98%  Weight: 203 lb 6.4 oz (92.3 kg)  Height: 6\' 3"  (1.905 m)   Body mass index is 25.42 kg/m.   General: Well developed, well nourished. No acute distress. Psych: Alert and oriented. Normal mood and affect.  Health Maintenance Due  Topic Date Due   COVID-19 Vaccine (1) Never done   Zoster Vaccines- Shingrix (1 of 2) Never done     Assessment & Plan:   1. Paroxysmal atrial fibrillation (HCC) 2. Status post ablation of atrial fibrillation Reviewed cardiology notes. Resolved. Continue follow-up with Dr. Rayann Heman as needed.  3. PSVT (paroxysmal supraventricular  tachycardia) (HCC) Minor. Avoid caffeine.  4. Screening for lipid disorders  - Lipid panel; Future  5. Screening for diabetes mellitus (DM)  - Hemoglobin A1c; Future - Glucose, random; Future  6. Screening for prostate cancer  - PSA; Future  7. Family history of malignant melanoma Mr. Larry Mathis request referral for skin surveillance.  - Ambulatory referral to Dermatology  Haydee Salter, MD

## 2021-05-16 ENCOUNTER — Other Ambulatory Visit (INDEPENDENT_AMBULATORY_CARE_PROVIDER_SITE_OTHER): Payer: BC Managed Care – PPO

## 2021-05-16 ENCOUNTER — Other Ambulatory Visit: Payer: Self-pay

## 2021-05-16 DIAGNOSIS — Z125 Encounter for screening for malignant neoplasm of prostate: Secondary | ICD-10-CM

## 2021-05-16 DIAGNOSIS — Z13220 Encounter for screening for lipoid disorders: Secondary | ICD-10-CM | POA: Diagnosis not present

## 2021-05-16 DIAGNOSIS — Z131 Encounter for screening for diabetes mellitus: Secondary | ICD-10-CM

## 2021-05-16 LAB — LIPID PANEL
Cholesterol: 191 mg/dL (ref 0–200)
HDL: 42.7 mg/dL (ref >39.00)
LDL Cholesterol: 129 mg/dL — ABNORMAL HIGH (ref 0–99)
NonHDL: 147.93
Total CHOL/HDL Ratio: 4
Triglycerides: 94 mg/dL (ref 0.0–149.0)
VLDL: 18.8 mg/dL (ref 0.0–40.0)

## 2021-05-16 LAB — PSA: PSA: 0.46 ng/mL (ref 0.10–4.00)

## 2021-05-16 LAB — HEMOGLOBIN A1C: Hgb A1c MFr Bld: 5.8 % (ref 4.6–6.5)

## 2021-05-16 LAB — GLUCOSE, RANDOM: Glucose, Bld: 105 mg/dL — ABNORMAL HIGH (ref 70–99)

## 2021-07-04 IMAGING — US US ABDOMEN LIMITED
1 series · 14 of 25 positions shown · non-contrast
Comparison: Abdominal ultrasound November 14, 2005

CLINICAL DATA: Right upper quadrant pain

EXAM:
ULTRASOUND ABDOMEN LIMITED RIGHT UPPER QUADRANT

[Series 1: us abdomen limited · 0.20mm/px · 14 of 36 slices shown]
[im 1/36]
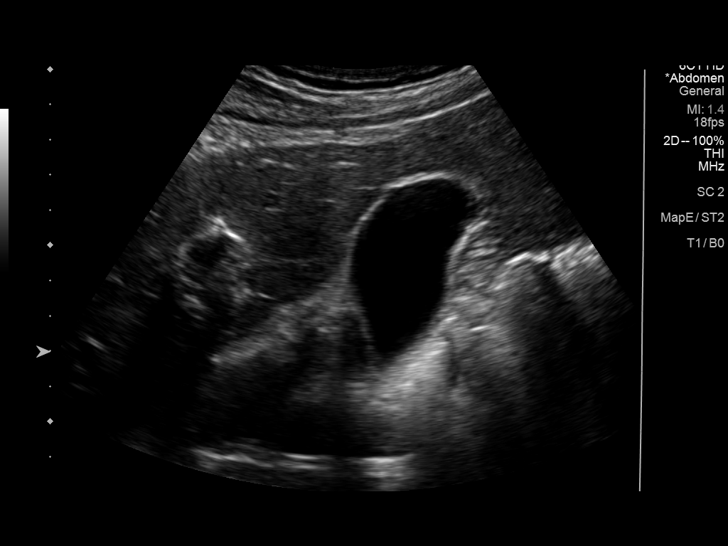
[im 3/36]
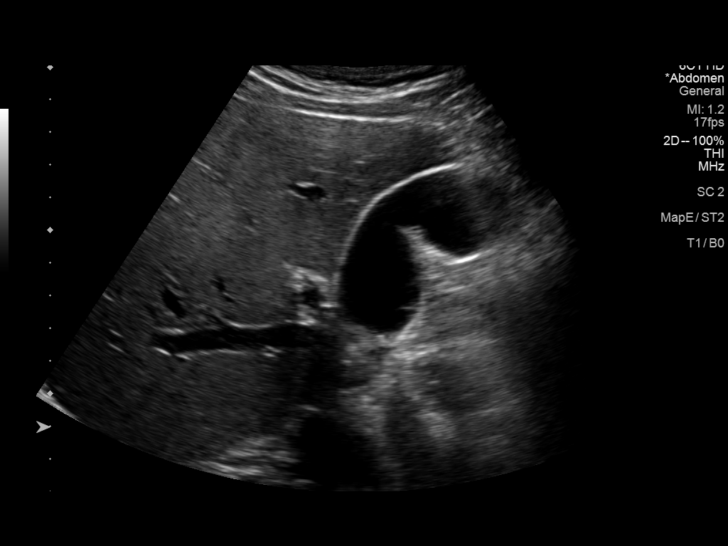
[im 6/36]
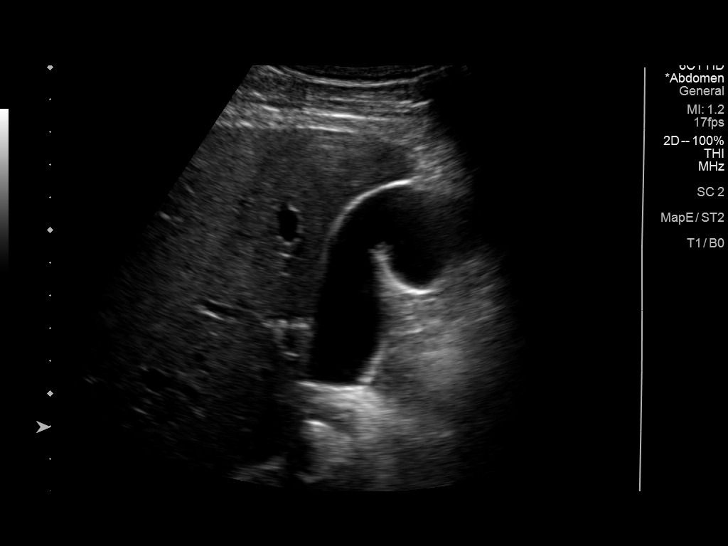
[im 9/36]
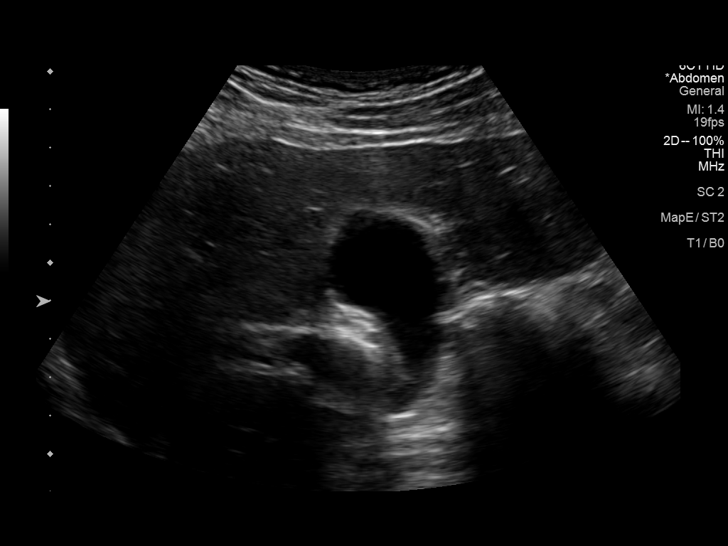
[im 12/36]
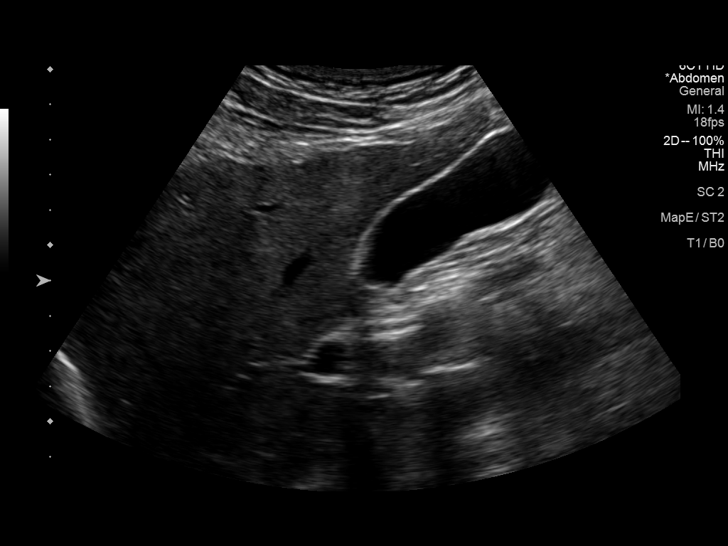
[im 14/36]
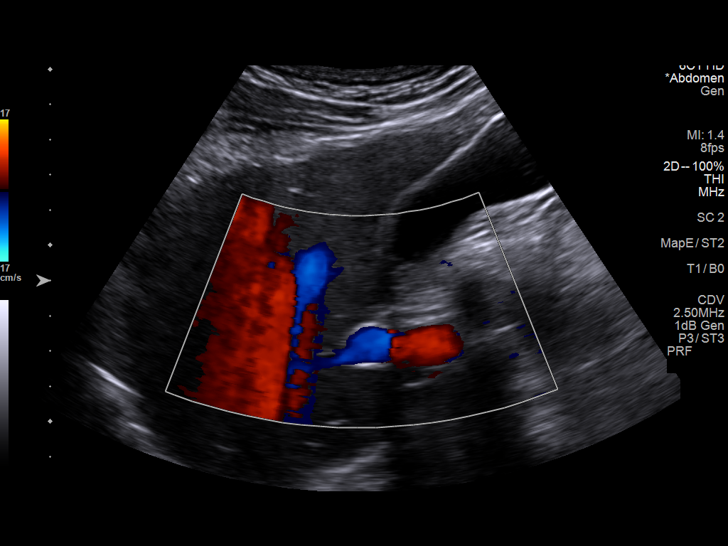
[im 17/36]
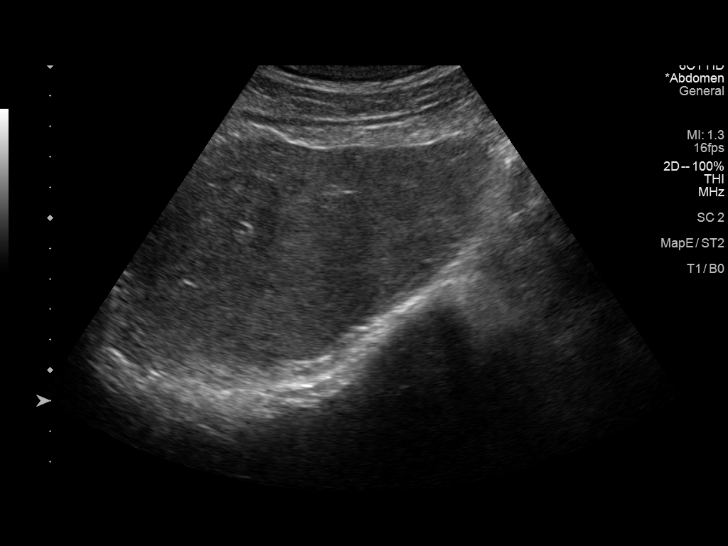
[im 19/36]
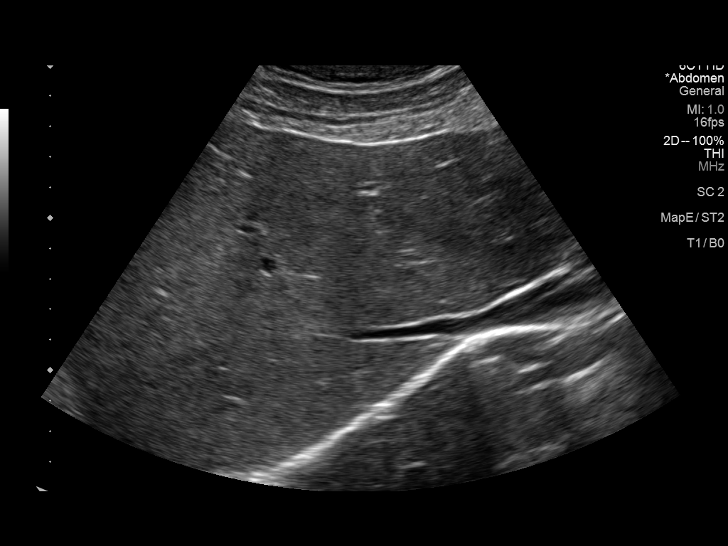
[im 22/36]
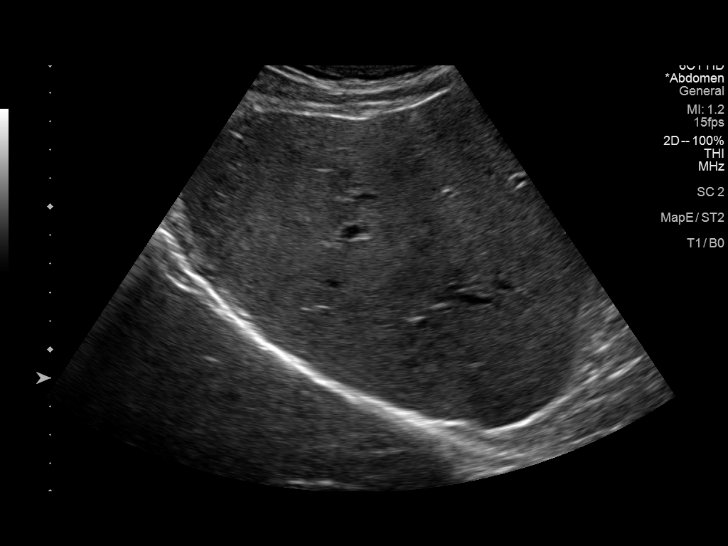
[im 24/36]
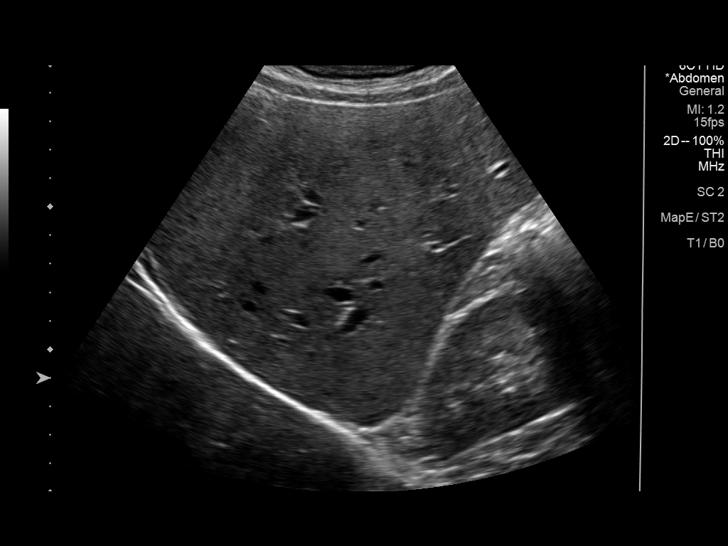
[im 27/36]
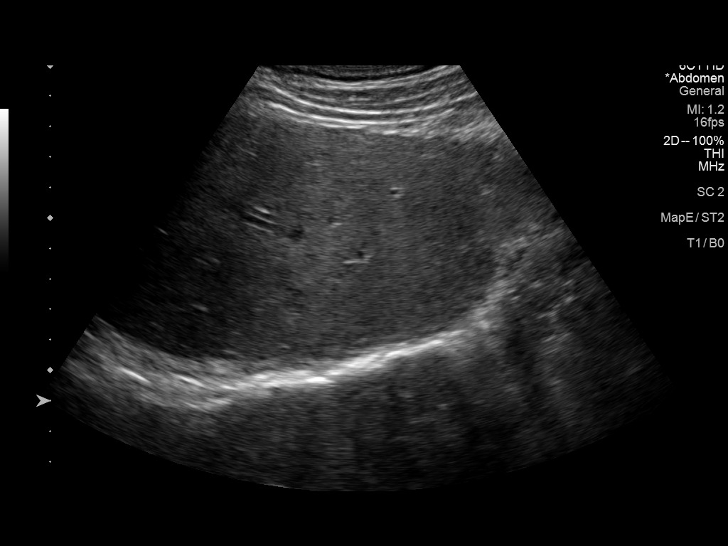
[im 30/36]
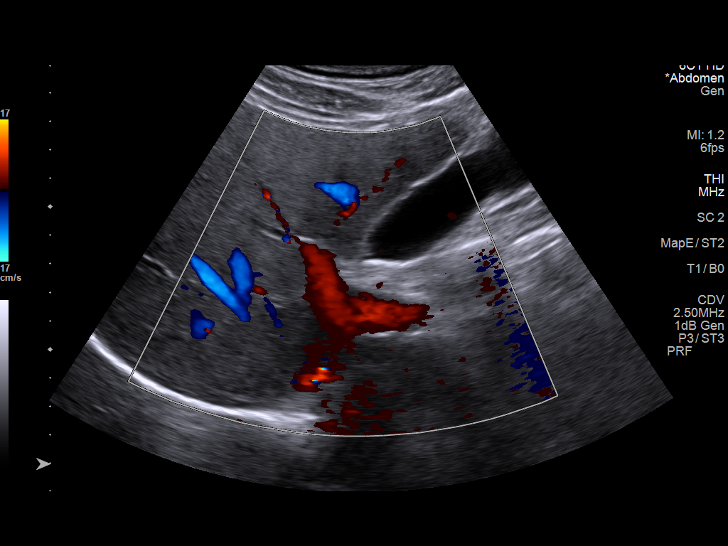
[im 33/36]
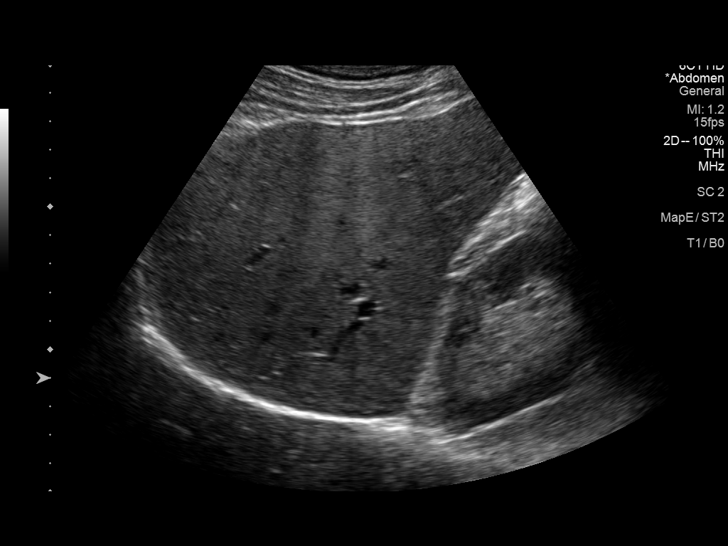
[im 36/36]
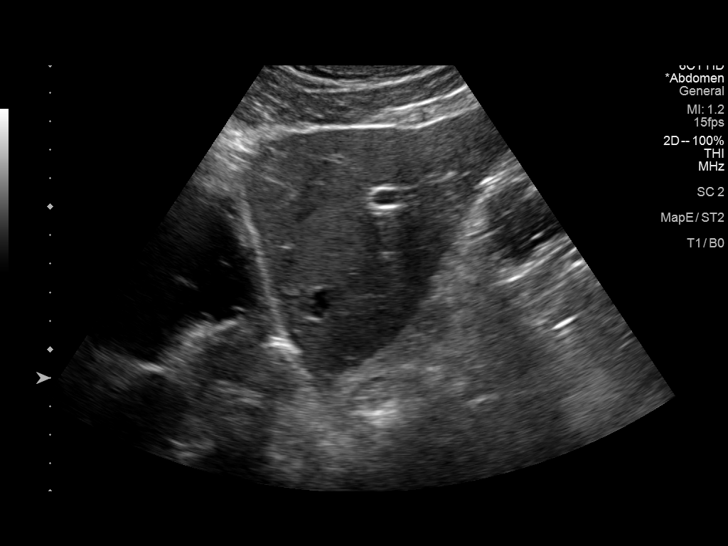

[14 of 25 positions shown; findings below may reference images not displayed]

FINDINGS: Gallbladder:

No gallstones or wall thickening visualized. There is no
pericholecystic fluid. There is a fold within the gallbladder. No
sonographic Murphy sign noted by sonographer.

Common bile duct:

Diameter: 3 mm. No intrahepatic or extrahepatic biliary duct
dilatation.

Liver:

No focal lesion identified. Within normal limits in parenchymal
echogenicity. Portal vein is patent on color Doppler imaging with
normal direction of blood flow towards the liver.

Other: None.
IMPRESSION: Study within normal limits.

## 2021-12-16 ENCOUNTER — Ambulatory Visit: Payer: BC Managed Care – PPO | Admitting: Dermatology

## 2021-12-20 ENCOUNTER — Ambulatory Visit (HOSPITAL_BASED_OUTPATIENT_CLINIC_OR_DEPARTMENT_OTHER): Payer: BC Managed Care – PPO | Admitting: Family

## 2021-12-20 ENCOUNTER — Encounter (HOSPITAL_BASED_OUTPATIENT_CLINIC_OR_DEPARTMENT_OTHER): Payer: Self-pay | Admitting: Family

## 2021-12-20 VITALS — BP 122/78 | HR 91 | Ht 75.0 in | Wt 215.0 lb

## 2021-12-20 DIAGNOSIS — R002 Palpitations: Secondary | ICD-10-CM | POA: Diagnosis not present

## 2021-12-20 DIAGNOSIS — Z9889 Other specified postprocedural states: Secondary | ICD-10-CM | POA: Diagnosis not present

## 2021-12-20 DIAGNOSIS — I480 Paroxysmal atrial fibrillation: Secondary | ICD-10-CM

## 2021-12-20 DIAGNOSIS — Z8679 Personal history of other diseases of the circulatory system: Secondary | ICD-10-CM | POA: Diagnosis not present

## 2021-12-20 DIAGNOSIS — F439 Reaction to severe stress, unspecified: Secondary | ICD-10-CM

## 2021-12-20 MED ORDER — HYDROXYZINE HCL 10 MG PO TABS: 10.0000 mg | ORAL_TABLET | Freq: Three times a day (TID) | ORAL | 2 refills | Status: DC | PRN

## 2021-12-20 MED ORDER — ATENOLOL 25 MG PO TABS
25.0000 mg | ORAL_TABLET | Freq: Two times a day (BID) | ORAL | 5 refills | Status: DC | PRN
Start: 2021-12-20 — End: 2022-06-25

## 2021-12-20 NOTE — Patient Instructions (Addendum)
Medication Instructions:  Your physician has recommended you make the following change in your medication:    START Atenolol twice daily as needed for palpitations  START Hydroxyzine three times daily as needed for anxiety  If you feel palpitations - take your Atenolol. If you do not have resolution in symptoms after 30 minutes - you can take the Hydroxyzine.   *If you need a refill on your cardiac medications before your next appointment, please call your pharmacy*  Lab Work: None ordered today.   Testing/Procedures: Your EKG today shows normal sinus rhythm. This is a great result!  Follow-Up: At [facility], you and your health needs are our priority.  As part of our continuing mission to provide you with exceptional heart care, we have created designated Provider Care Teams.  These Care Teams include your primary Cardiologist (physician) and Advanced Practice Providers (APPs -  Physician Assistants and Nurse Practitioners) who all work together to provide you with the care you need, when you need it.  We recommend signing up for the patient portal called "MyChart".  Sign up information is provided on this After Visit Summary.  MyChart is used to connect with patients for Virtual Visits (Telemedicine).  Patients are able to view lab/test results, encounter notes, upcoming appointments, etc.  Non-urgent messages can be sent to your provider as well.   To learn more about what you can do with MyChart, go to [email or web address].    Your next appointment:   6 month(s)  The format for your next appointment:   In Person  Provider:   Laurann Montana, NP   Other Instructions  To prevent palpitations: Make sure you are adequately hydrated.  Avoid and/or limit caffeine containing beverages like soda or tea. Exercise regularly.  Manage stress well. Some over the counter medications can cause palpitations such as Benadryl, AdvilPM, TylenolPM. Regular Advil or Tylenol do not cause  palpitations.    Heart Healthy Diet Recommendations: A low-salt diet is recommended. Meats should be grilled, baked, or boiled. Avoid fried foods. Focus on lean protein sources like fish or chicken with vegetables and fruits. The American Heart Association is a Microbiologist!  American Heart Association Diet and Lifeystyle Recommendations   Exercise recommendations: The American Heart Association recommends 150 minutes of moderate intensity exercise weekly. Try 30 minutes of moderate intensity exercise 4-5 times per week. This could include walking, jogging, or swimming.

## 2021-12-20 NOTE — Progress Notes (Signed)
Office Visit    Patient Name: Larry Mathis Date of Encounter: 12/20/2021  PCP:  Haydee Salter, MD   [place]  Cardiologist:  None  Advanced Practice Provider:  Loel Dubonnet, NP Electrophysiologist:  None   Chief Complaint    Larry Mathis is a 61 y.o. male with a hx of anxiety, PAF, PAC, dyspnea presents today for elevated heart rate  Past Medical History    Past Medical History:  Diagnosis Date   Anxiety    Chest pain    Fatigue    Lightheadedness    PAC (premature atrial contraction)    Paroxysmal atrial fibrillation (HCC)    SOB (shortness of breath)    Past Surgical History:  Procedure Laterality Date   ATRIAL FIBRILLATION ABLATION N/A 07/24/2017   Procedure: ATRIAL FIBRILLATION ABLATION;  Surgeon: Thompson Grayer, MD;  Location: [facility];  Service: Cardiovascular;  Laterality: N/A;   COLONOSCOPY     INGUINAL HERNIA REPAIR Right    LUMBAR DISC SURGERY     L4-5    Allergies  No Known Allergies  History of Present Illness    Larry Mathis is a 62 y.o. male with a hx of anxiety, PAF, PAC, dyspnea  last seen 02/21/21  He had previous atrial fibrillation ablation with Dr. Rayann Heman March 2019.  He has not been maintained on anticoagulation as his CHA2DS2-VASc score is 0.  He was seen 01/13/2021 noting atypical chest pain in setting of anxiety that occurred at rest.  He was recommended for Myoview and monitor.  Lexiscan Myoview 01/15/2021 was normal study with no evidence of ischemia nor prior ischemia.  LVEF was normal 61%.  Preliminary report of monitor revealed predominantly normal sinus rhythm with 4 episodes of SVT with fastest 8 beats at a rate of 203 bpm which were asymptomatic.  He was last seen 02/21/2021 doing well from a cardiac perspective.  Did not anxiety associated chest pain a few weeks prior but noted recent stressors and chest wall tender on palpation consistent with musculoskeletal chest pain.  Resolved  with ibuprofen.  He presents today for follow-up.  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  Endorses that his job is very active.  Last Tuesday was laying on his couch wathcing tv and felt his hair was standing up on the left side of heart and he felt heart pounding. Thursday doing yardwork on a cooler day was laying odwn and felt heart racing.  Notes stressors with moving into a new home.  BP readings at home as low as BP 105/74 with HR 104 bpm.  More often 120s over 80s.  No chest pain, dyspnea, edema.  EKGs/Labs/Other Studies Reviewed:   The following studies were reviewed today:  Monitor 02/05/21 Preliminary report Patient had a min HR of 49 bpm, max HR of 203 bpm, and avg HR of 82 bpm. Predominant underlying rhythm was Sinus Rhythm. 4 Supraventricular Tachycardia runs occurred, the run with the fastest interval lasting 8 beats with a max rate of 203 bpm, the  longest lasting 7 beats with an avg rate of 110 bpm. Isolated SVEs were rare (<1.0%), SVE Couplets were rare (<1.0%), and SVE Triplets were rare (<1.0%). Isolated VEs were rare (<1.0%), VE Couplets were rare (<1.0%), and no VE Triplets were present.   Myoview 01/15/21   The study is normal. Findings are consistent with no prior ischemia. The study is low risk.   No ST deviation was noted.  LV perfusion is normal. There is no evidence of ischemia. There is no evidence of infarction.   Left ventricular function is normal. Nuclear stress EF: 61 %. The left ventricular ejection fraction is normal (55-65%). End diastolic cavity size is normal. End systolic cavity size is normal.   Prior study available for comparison from 05/22/2009. Unable to view prior study.   Negative stress test with no evidence of ischemia.   EKG:  EKg ordered today. EKG performed today demonstrates SR 91 bpm.    Recent Labs: 12/21/2020: BUN 15; Creatinine, Ser 1.18; Hemoglobin 15.6; Platelets 236; Potassium 3.5; Sodium 138  Recent Lipid Panel    Component Value Date/Time    CHOL 191 05/16/2021 0856   TRIG 94.0 05/16/2021 0856   HDL 42.70 05/16/2021 0856   CHOLHDL 4 05/16/2021 0856   VLDL 18.8 05/16/2021 0856   LDLCALC 129 (H) 05/16/2021 0856    Home Medications   Current Meds  Medication Sig   atenolol (TENORMIN) 25 MG tablet Take 1 tablet (25 mg total) by mouth 2 (two) times daily as needed (palpitations).   hydrOXYzine (ATARAX) 10 MG tablet Take 1 tablet (10 mg total) by mouth 3 (three) times daily as needed for anxiety.     Review of Systems      All other systems reviewed and are otherwise negative except as noted above.  Physical Exam    VS:  BP 122/78   Pulse 91   Ht '6\' 3"'$  (1.905 m)   Wt 215 lb (97.5 kg)   BMI 26.87 kg/m  , BMI Body mass index is 26.87 kg/m.  Wt Readings from Last 3 Encounters:  12/20/21 215 lb (97.5 kg)  05/15/21 203 lb 6.4 oz (92.3 kg)  02/21/21 210 lb 9.6 oz (95.5 kg)    GEN: Well nourished, well developed, in no acute distress. HEENT: normal. Neck: Supple, no JVD, carotid bruits, or masses. Cardiac: RRR, no murmurs, rubs, or gallops. No clubbing, cyanosis, edema.  Radials/PT 2+ and equal bilaterally.  Respiratory:  Respirations regular and unlabored, clear to auscultation bilaterally. GI: Soft, nontender, nondistended. MS: No deformity or atrophy. Skin: Warm and dry, no rash. Neuro:  Strength and sensation are intact. Psych: Normal affect.  Assessment & Plan    PAF s/p ablation -no recurrent atrial fibrillation by ZIO monitor 12/2020.  Did show 4 episodes of SVT which were asymptomatic. Anticipate recent palpitations are SVT vs PVC. Likely due to recent stress, not being as active. Rx Atenolol '25mg'$  PRN for palpitations (choosing atenolol as he has used in the past with good response).  Rx hydroxyzine 10 mg as needed for anxiety as this is contributory to his palpitations.  Encouraged to discuss anxiolytic with his primary care provider.    Chest pain -Myoview 01/15/2021 low risk study with no evidence of  ischemia.  Prior chest pain in the setting of anxiety.  Stable with no anginal symptoms. No indication for ischemic evaluation.    Disposition: Follow up in 6 month(s) with Loel Dubonnet, NP   Signed, Loel Dubonnet, NP 12/20/2021, 4:54 PM [place]

## 2022-05-19 ENCOUNTER — Ambulatory Visit (INDEPENDENT_AMBULATORY_CARE_PROVIDER_SITE_OTHER): Payer: BC Managed Care – PPO | Admitting: Family Medicine

## 2022-05-19 ENCOUNTER — Encounter: Payer: Self-pay | Admitting: Family Medicine

## 2022-05-19 VITALS — BP 138/86 | HR 95 | Temp 95.0°F | Ht 75.0 in | Wt 217.2 lb

## 2022-05-19 DIAGNOSIS — R030 Elevated blood-pressure reading, without diagnosis of hypertension: Secondary | ICD-10-CM | POA: Insufficient documentation

## 2022-05-19 DIAGNOSIS — R7303 Prediabetes: Secondary | ICD-10-CM

## 2022-05-19 DIAGNOSIS — I471 Supraventricular tachycardia, unspecified: Secondary | ICD-10-CM

## 2022-05-19 DIAGNOSIS — Z125 Encounter for screening for malignant neoplasm of prostate: Secondary | ICD-10-CM

## 2022-05-19 DIAGNOSIS — Z0000 Encounter for general adult medical examination without abnormal findings: Secondary | ICD-10-CM

## 2022-05-19 LAB — PSA: PSA: 0.56 ng/mL (ref 0.10–4.00)

## 2022-05-19 LAB — BASIC METABOLIC PANEL
BUN: 15 mg/dL (ref 6–23)
CO2: 28 mEq/L (ref 19–32)
Calcium: 9.2 mg/dL (ref 8.4–10.5)
Chloride: 100 mEq/L (ref 96–112)
Creatinine, Ser: 1.01 mg/dL (ref 0.40–1.50)
GFR: 80.78 mL/min (ref >60.00)
Glucose, Bld: 83 mg/dL (ref 70–99)
Potassium: 4.2 mEq/L (ref 3.5–5.1)
Sodium: 138 mEq/L (ref 135–145)

## 2022-05-19 LAB — HEMOGLOBIN A1C: Hgb A1c MFr Bld: 6 % (ref 4.6–6.5)

## 2022-05-19 NOTE — Progress Notes (Signed)
[facility] [address] [place] [address] Dept: [phone] Dept Fax: [phone]  Annual Physical Visit  Subjective:    Patient ID: Larry Mathis, male    DOB: 06-30-1960, 62 y.o..   MRN: [id number]  Chief Complaint  Patient presents with   Annual Exam    CPE/labs.  Not fasting today.  C/o having a spont on his nose (referral).     History of Present Illness:  Patient is in today for an annual physical/preventative visit.  Mr. Beltran has a history of paroxysmal atrial fibrillation. This was resolved with a cardiac ablation and he no longer has need for medication or anticoagulation (CHA2DS2-VASc= 0). Mr. Birnie has occasional episodes of PSVT. He is managed on atenolol 25 mg bid and PRN hydroxyzine.  Moved to [place] past year. Likes to have more room (30+ acres), but does not like the longer drive to work.  Review of Systems  Constitutional:  Negative for chills, fever, malaise/fatigue and weight loss.  HENT:  Negative for congestion, ear discharge, ear pain, hearing loss, sinus pain, sore throat and tinnitus.   Eyes:  Negative for blurred vision, pain, discharge and redness.  Respiratory:  Negative for cough, shortness of breath and wheezing.   Cardiovascular:  Positive for palpitations. Negative for chest pain and leg swelling.       As noted above.  Gastrointestinal:  Negative for abdominal pain, constipation, diarrhea, heartburn, nausea and vomiting.  Musculoskeletal:  Negative for back pain, joint pain, myalgias and neck pain.  Skin:  Negative for rash.  Psychiatric/Behavioral:  Negative for depression. The patient is not nervous/anxious.    Past Medical History: Patient Active Problem List   Diagnosis Date Noted   Prediabetes 05/19/2022   Elevated blood-pressure reading without diagnosis of hypertension 05/19/2022   Paroxysmal atrial fibrillation ([place]) 05/15/2021   Status post ablation of  atrial fibrillation 05/15/2021   Family history of malignant melanoma 05/15/2021   PSVT (paroxysmal supraventricular tachycardia) 05/16/2009   Past Surgical History:  Procedure Laterality Date   ATRIAL FIBRILLATION ABLATION N/A 07/24/2017   Procedure: ATRIAL FIBRILLATION ABLATION;  Surgeon: Thompson Grayer, MD;  Location: [facility];  Service: Cardiovascular;  Laterality: N/A;   COLONOSCOPY     INGUINAL HERNIA REPAIR Right    LUMBAR DISC SURGERY     L4-5   Family History  Problem Relation Age of Onset   Arthritis Mother    Cancer Mother        breast   Cancer Father        Melanoma   [name] Parkinson White syndrome Father    Cancer Brother        Melanoma   Heart disease Brother    Heart murmur Brother    Atrial fibrillation Brother    Alzheimer's disease Maternal Aunt    Cancer Paternal Aunt        Brain   Cancer Paternal Uncle        Bladder   Alzheimer's disease Maternal Grandmother    Cancer Paternal Grandmother    Cancer Paternal Grandfather        Bladder   Colon cancer Neg Hx    Esophageal cancer Neg Hx    Rectal cancer Neg Hx    Stomach cancer Neg Hx     Outpatient Medications Prior to Visit  Medication Sig Dispense Refill   atenolol (TENORMIN) 25 MG tablet Take 1 tablet (25 mg total) by mouth 2 (  two) times daily as needed (palpitations). 30 tablet 5   hydrOXYzine (ATARAX) 10 MG tablet Take 1 tablet (10 mg total) by mouth 3 (three) times daily as needed for anxiety. 30 tablet 2   Facility-Administered Medications Prior to Visit  Medication Dose Route Frequency Provider Last Rate Last Admin   technetium tetrofosmin (TC-MYOVIEW) injection 37.8 millicurie  58.8 millicurie Intravenous Once PRN Skeet Latch, MD        No Known Allergies    Objective:   Today's Vitals   05/19/22 0900 05/19/22 0952 05/19/22 1000  BP: (!) 132/90 132/86 138/86  Pulse: 95    Temp: (!) 95 F (35 C)    TempSrc: Temporal    SpO2: 96%    Weight: 217 lb 3.2 oz (98.5  kg)    Height: '6\' 3"'$  (1.905 m)     Body mass index is 27.15 kg/m.   General: Well developed, well nourished. No acute distress. HEENT: Normocephalic, non-traumatic. PERRL, EOMI. Conjunctiva clear. External ears normal. EAC and TMs   normal bilaterally. Nose clear without congestion or rhinorrhea. Mucous membranes moist. Oropharynx clear.   Good dentition. Neck: Supple. No lymphadenopathy. No thyromegaly. Lungs: Clear to auscultation bilaterally. No wheezing, rales or rhonchi. CV: RRR without murmurs or rubs. Pulses 2+ bilaterally. Abdomen: Soft, non-tender. Bowel sounds positive, normal pitch and frequency. No hepatosplenomegaly. No   rebound or guarding. Back: Straight. No CVA tenderness bilaterally. Extremities: Full ROM. No joint swelling or tenderness. No edema noted. Skin: Warm and dry. No rashes. Small pinpoint scab on left side of nose. No sign of other skin changes. Psych: Alert and oriented. Normal mood and affect.  Health Maintenance Due  Topic Date Due   Zoster Vaccines- Shingrix (1 of 2) Never done     Assessment & Plan:   1. Annual physical exam Overall good health. Encourage him ot engage in regular exercise. Reviewed recommended screenigns and immunizations.  2. PSVT (paroxysmal supraventricular tachycardia) Stable. Cotninue atenolol 25 mg bid and PRN hydroxyzine.  - Basic metabolic panel  3. Screening for prostate cancer  - PSA  4. Prediabetes  - Hemoglobin A1c  5. Elevated blood-pressure reading without diagnosis of hypertension Blood pressure is mildly high today. Weight is up 13 lbs over past year. We discussed efforts at regular exercise and weight loss to help reduce his blood pressure.  - Basic metabolic panel   Return in about 1 year (around 05/20/2023) for Reassessment.   Haydee Salter, MD

## 2022-06-25 ENCOUNTER — Other Ambulatory Visit (HOSPITAL_BASED_OUTPATIENT_CLINIC_OR_DEPARTMENT_OTHER): Payer: Self-pay | Admitting: Family

## 2022-06-25 DIAGNOSIS — F439 Reaction to severe stress, unspecified: Secondary | ICD-10-CM

## 2022-06-25 DIAGNOSIS — R002 Palpitations: Secondary | ICD-10-CM

## 2022-06-25 DIAGNOSIS — I480 Paroxysmal atrial fibrillation: Secondary | ICD-10-CM

## 2022-06-25 DIAGNOSIS — Z8679 Personal history of other diseases of the circulatory system: Secondary | ICD-10-CM

## 2023-02-25 ENCOUNTER — Other Ambulatory Visit (HOSPITAL_BASED_OUTPATIENT_CLINIC_OR_DEPARTMENT_OTHER): Payer: Self-pay

## 2023-02-25 ENCOUNTER — Other Ambulatory Visit (HOSPITAL_BASED_OUTPATIENT_CLINIC_OR_DEPARTMENT_OTHER): Payer: Self-pay | Admitting: Family

## 2023-02-25 DIAGNOSIS — R002 Palpitations: Secondary | ICD-10-CM

## 2023-02-25 DIAGNOSIS — I480 Paroxysmal atrial fibrillation: Secondary | ICD-10-CM

## 2023-02-25 DIAGNOSIS — Z9889 Other specified postprocedural states: Secondary | ICD-10-CM

## 2023-02-25 MED ORDER — ATENOLOL 25 MG PO TABS: 25.0000 mg | ORAL_TABLET | Freq: Two times a day (BID) | ORAL | 0 refills | Status: DC | PRN

## 2023-03-04 ENCOUNTER — Ambulatory Visit: Payer: BC Managed Care – PPO | Admitting: Dermatology

## 2023-03-18 ENCOUNTER — Encounter: Payer: Self-pay | Admitting: Gastroenterology

## 2023-04-11 IMAGING — DX DG CHEST 2V
3 series · 3 of 3 positions shown · non-contrast
Comparison: April 17, 2017

CLINICAL DATA: Left-sided chest pain.

EXAM:
CHEST - 2 VIEW

[chest pa]
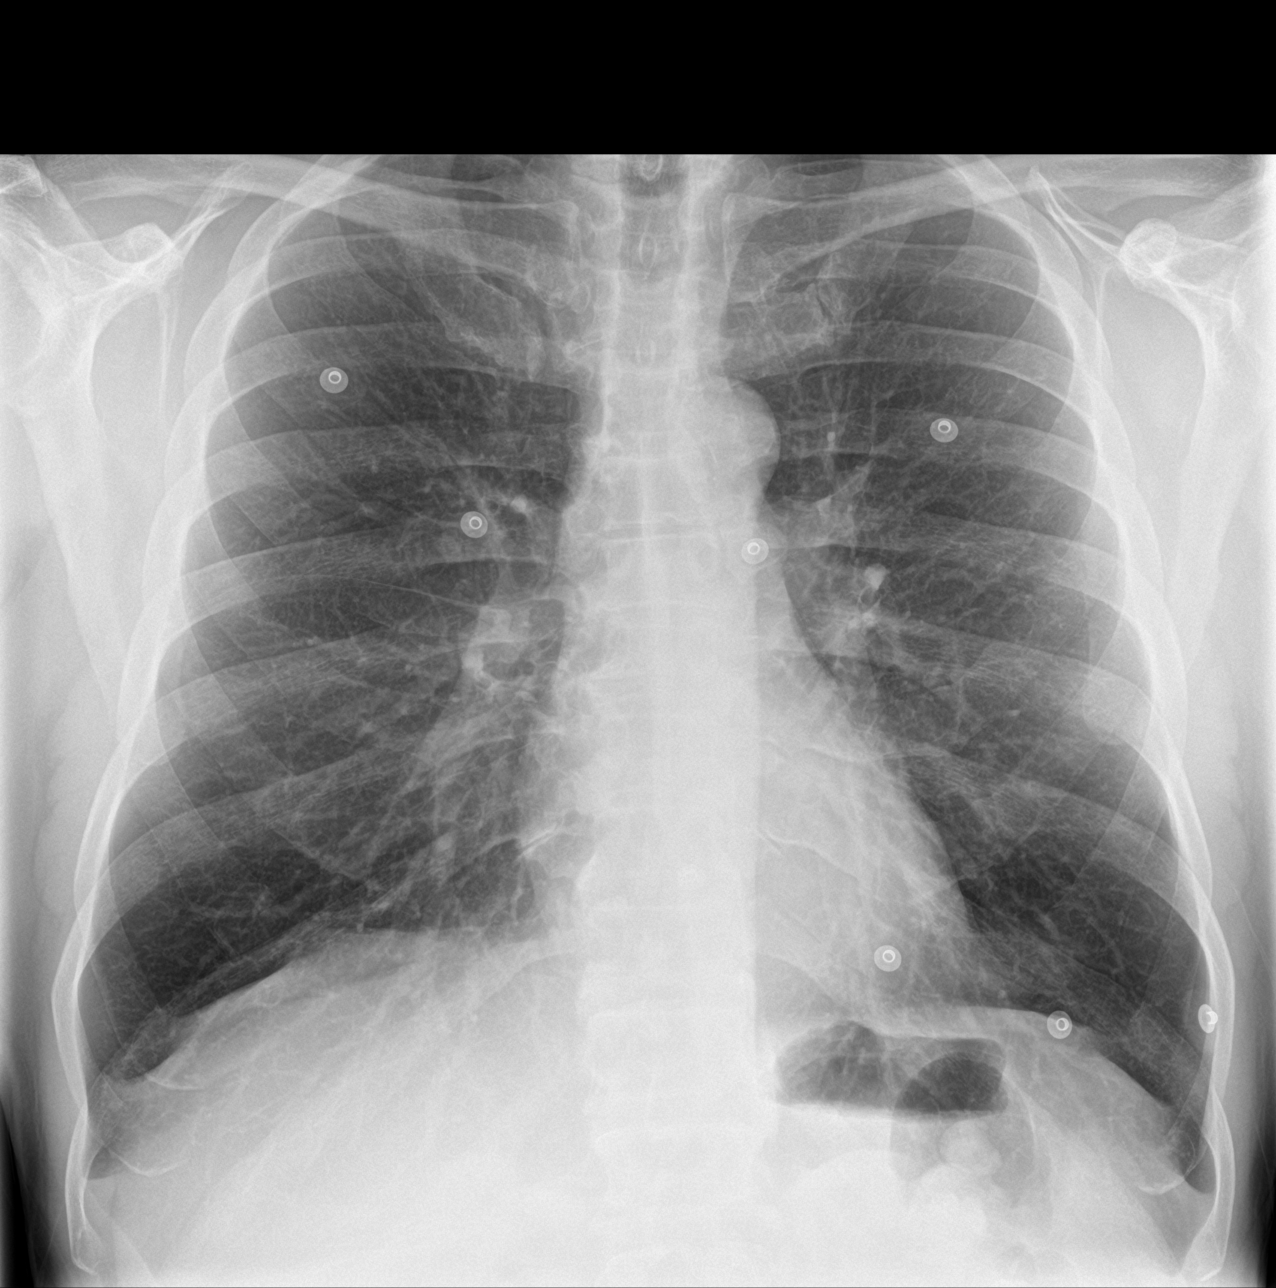

[chest lat (1 of 2)]
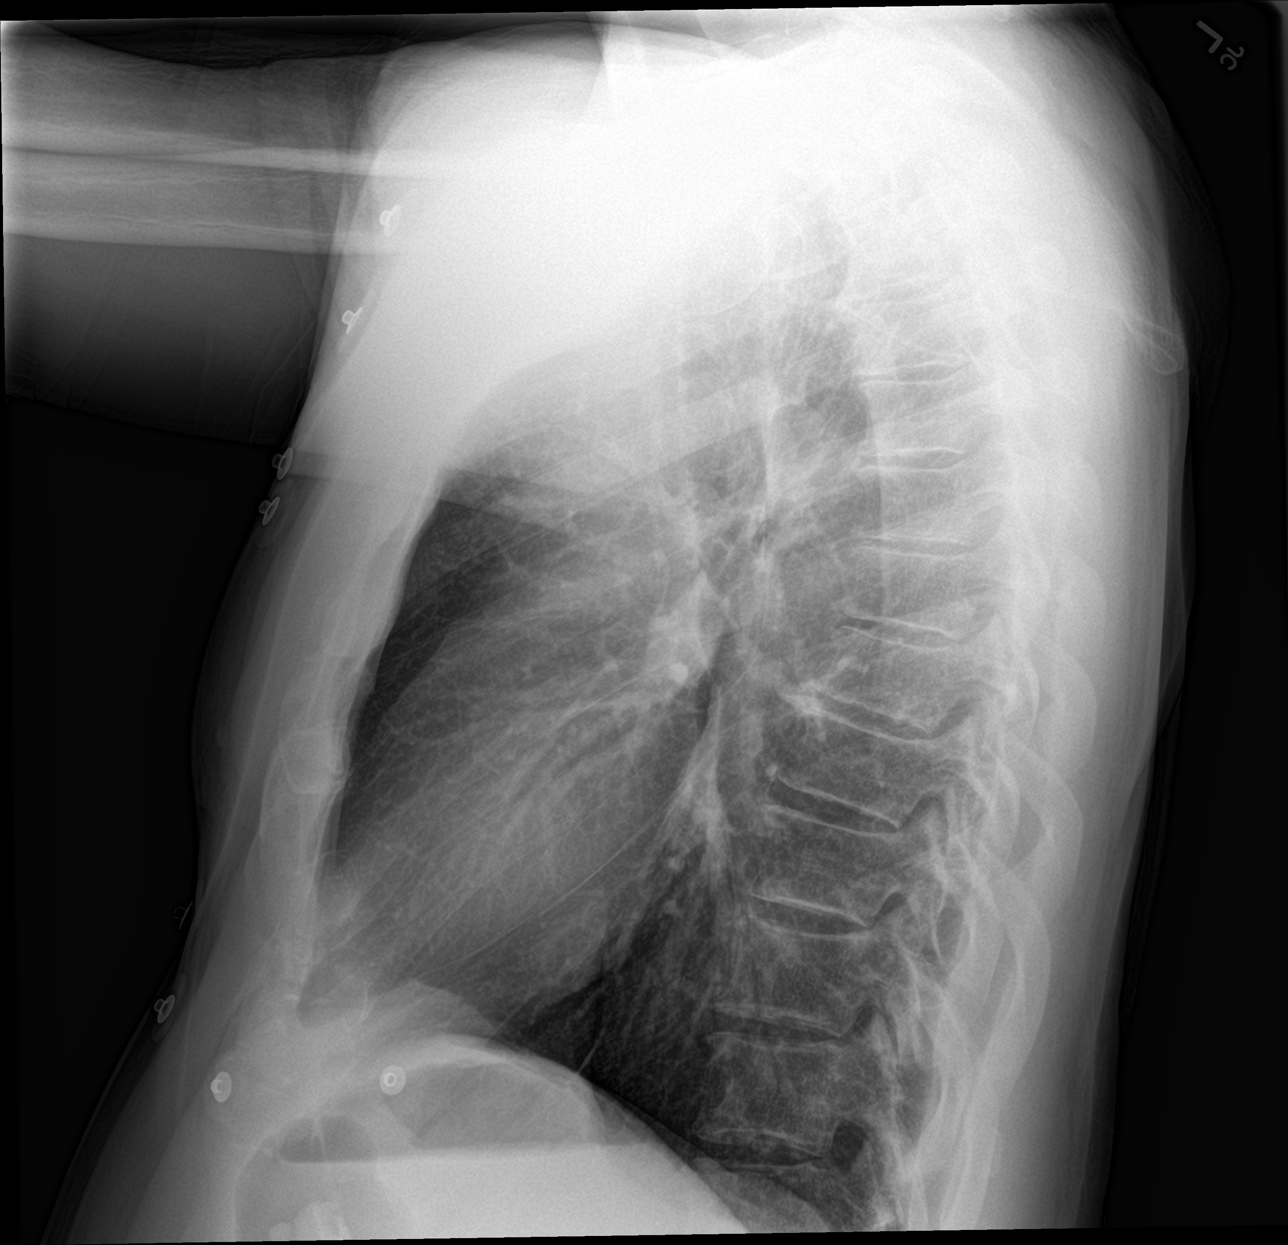

[chest lat (2 of 2)]
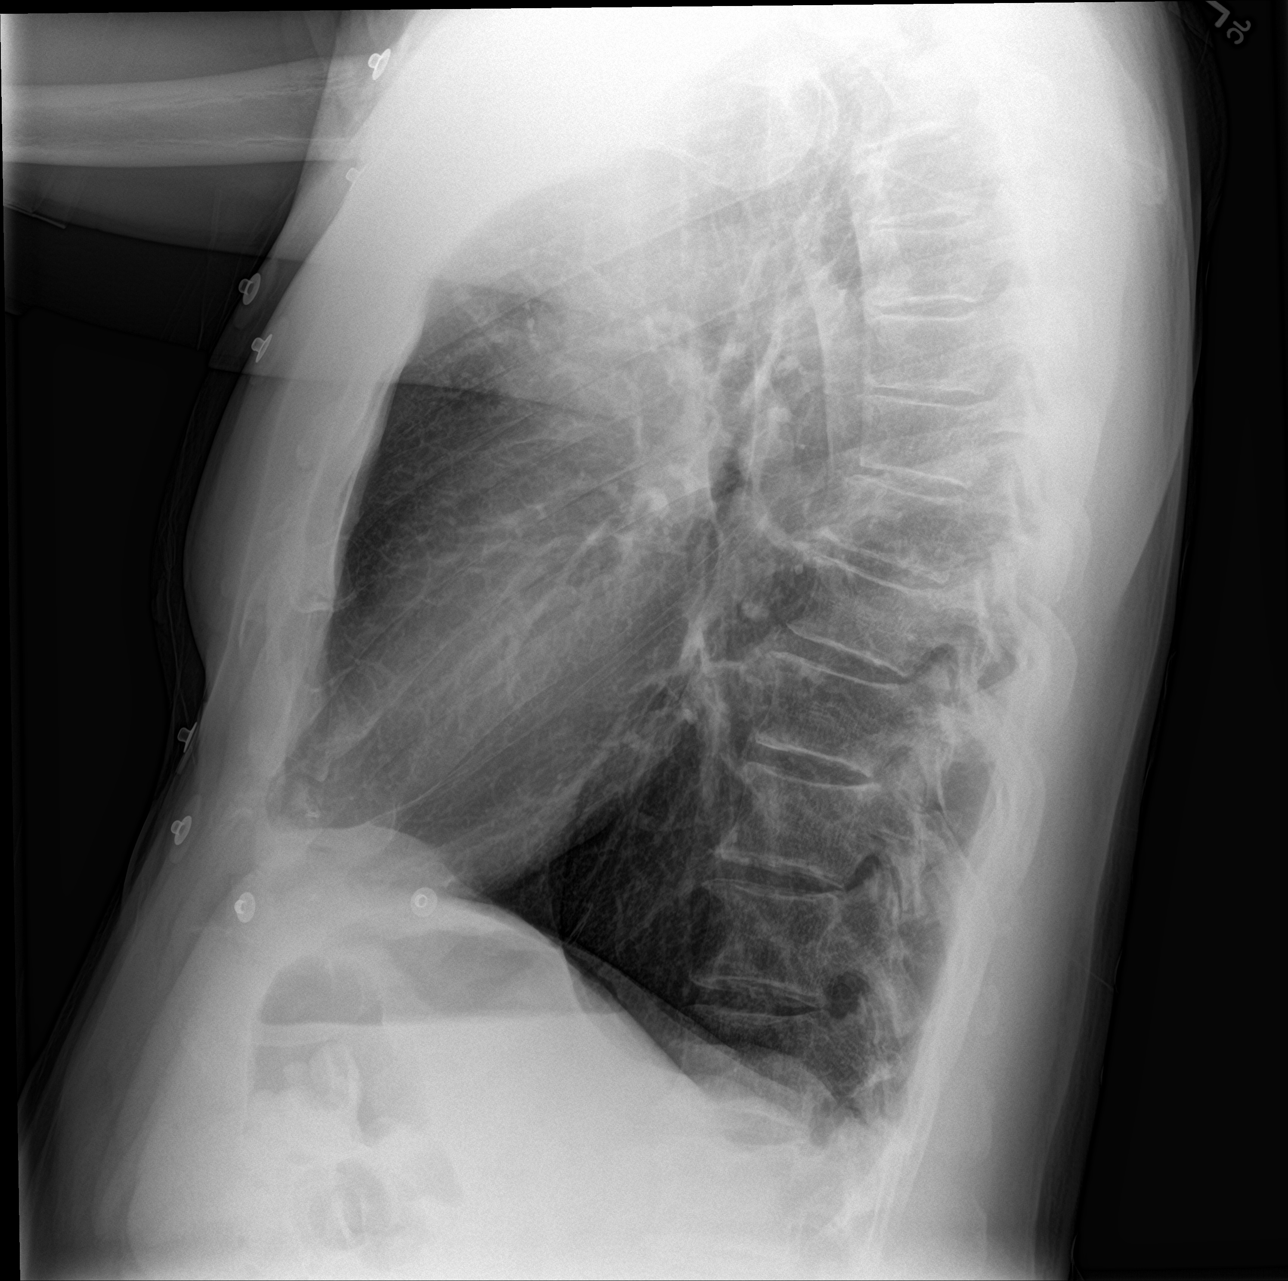

[3 of 3 positions shown; findings below may reference images not displayed]

FINDINGS: The heart size and mediastinal contours are within normal limits.
Both lungs are clear. The visualized skeletal structures are
unremarkable.
IMPRESSION: No active cardiopulmonary disease.

## 2023-05-22 ENCOUNTER — Encounter: Payer: Self-pay | Admitting: Family Medicine

## 2023-05-22 ENCOUNTER — Ambulatory Visit (INDEPENDENT_AMBULATORY_CARE_PROVIDER_SITE_OTHER): Payer: 59 | Admitting: Family Medicine

## 2023-05-22 VITALS — BP 132/80 | HR 88 | Temp 97.6°F | Ht 75.0 in | Wt 220.0 lb

## 2023-05-22 DIAGNOSIS — Z125 Encounter for screening for malignant neoplasm of prostate: Secondary | ICD-10-CM | POA: Diagnosis not present

## 2023-05-22 DIAGNOSIS — F439 Reaction to severe stress, unspecified: Secondary | ICD-10-CM

## 2023-05-22 DIAGNOSIS — R7303 Prediabetes: Secondary | ICD-10-CM

## 2023-05-22 DIAGNOSIS — Z0000 Encounter for general adult medical examination without abnormal findings: Secondary | ICD-10-CM

## 2023-05-22 LAB — BASIC METABOLIC PANEL
BUN: 16 mg/dL (ref 6–23)
CO2: 28 meq/L (ref 19–32)
Calcium: 9 mg/dL (ref 8.4–10.5)
Chloride: 100 meq/L (ref 96–112)
Creatinine, Ser: 1.04 mg/dL (ref 0.40–1.50)
GFR: 77.44 mL/min (ref >60.00)
Glucose, Bld: 96 mg/dL (ref 70–99)
Potassium: 4 meq/L (ref 3.5–5.1)
Sodium: 137 meq/L (ref 135–145)

## 2023-05-22 LAB — PSA: PSA: 0.43 ng/mL (ref 0.10–4.00)

## 2023-05-22 LAB — HEMOGLOBIN A1C: Hgb A1c MFr Bld: 6 % (ref 4.6–6.5)

## 2023-05-22 MED ORDER — HYDROXYZINE HCL 10 MG PO TABS: 10.0000 mg | ORAL_TABLET | Freq: Three times a day (TID) | ORAL | 0 refills | Status: AC | PRN

## 2023-05-22 NOTE — Assessment & Plan Note (Signed)
I will check an A1c today.

## 2023-05-22 NOTE — Progress Notes (Signed)
[facility] [address] [place] [address] Dept: [phone] Dept Fax: [phone]  Annual Physical Visit  Subjective:    Patient ID: Larry Mathis, male    DOB: 24-Sep-1960, 63 y.o..   MRN: [id number]  Chief Complaint  Patient presents with   Annual Exam    CPE/labs.  Not fasting today.  C/o having back pain x 1 day.    History of Present Illness:  Patient is in today for an annual physical/preventative visit.  Larry Mathis has a history of paroxysmal atrial fibrillation. This was resolved with a cardiac ablation and he no longer has need for medication or anticoagulation (CHA2DS2-VASc= 0). Larry Mathis has occasional episodes of PSVT. He is managed on atenolol 25 mg bid and PRN hydroxyzine. He finds stress tends to set off episodes of his palpitations.  Review of Systems  Constitutional:  Negative for chills, diaphoresis, fever, malaise/fatigue and weight loss.  HENT:  Positive for hearing loss and tinnitus. Negative for congestion, ear pain, sinus pain and sore throat.        Intermittent tinnitus in right ear. Has some decreased hearing on the left. Is using hearing protection when needed for working around loud noises.  Eyes:  Negative for blurred vision, pain, discharge and redness.  Respiratory:  Negative for cough, shortness of breath and wheezing.   Cardiovascular:  Positive for palpitations. Negative for chest pain.       Palpitations as noted above.  Gastrointestinal:  Negative for abdominal pain, constipation, diarrhea, heartburn, nausea and vomiting.  Musculoskeletal:  Positive for back pain. Negative for joint pain and myalgias.       Acute right lower back pain secondary to lifting a heavy motor at work yesterday. Notes this does happen periodically. No current lower leg symptoms, though he has had radiculopathy in the past.  Skin:  Negative for itching and rash.  Neurological:  Positive for headaches.        Intermittent left parietal headache. Resolves with rest and use of ice.  Psychiatric/Behavioral:  Negative for depression. The patient is not nervous/anxious.    Past Medical History: Patient Active Problem List   Diagnosis Date Noted   Prediabetes 05/19/2022   Elevated blood-pressure reading without diagnosis of hypertension 05/19/2022   Paroxysmal atrial fibrillation (HCC) 05/15/2021   Status post ablation of atrial fibrillation 05/15/2021   Family history of malignant melanoma 05/15/2021   PSVT (paroxysmal supraventricular tachycardia) (HCC) 05/16/2009   Past Surgical History:  Procedure Laterality Date   ATRIAL FIBRILLATION ABLATION N/A 07/24/2017   Procedure: ATRIAL FIBRILLATION ABLATION;  Surgeon: Hillis Range, MD;  Location: [facility];  Service: Cardiovascular;  Laterality: N/A;   COLONOSCOPY     INGUINAL HERNIA REPAIR Right    LUMBAR DISC SURGERY     L4-5   MOHS SURGERY     basil cell on nose.   Family History  Problem Relation Age of Onset   Arthritis Mother    Cancer Mother        breast   Cancer Father        Melanoma   Evelene Croon Parkinson White syndrome Father    Cancer Brother        Melanoma   Heart disease Brother    Heart murmur Brother    Atrial fibrillation Brother    Alzheimer's disease Maternal Aunt    Cancer Paternal Aunt        Brain   Cancer Paternal Uncle  Bladder   Alzheimer's disease Maternal Grandmother    Cancer Paternal Grandmother    Cancer Paternal Grandfather        Bladder   Colon cancer Neg Hx    Esophageal cancer Neg Hx    Rectal cancer Neg Hx    Stomach cancer Neg Hx    Outpatient Medications Prior to Visit  Medication Sig Dispense Refill   atenolol (TENORMIN) 25 MG tablet Take 1 tablet (25 mg total) by mouth 2 (two) times daily as needed. 90 tablet 0   hydrOXYzine (ATARAX) 10 MG tablet TAKE 1 TABLET(10 MG) BY MOUTH THREE TIMES DAILY AS NEEDED FOR ANXIETY 90 tablet 0   Facility-Administered Medications Prior to  Visit  Medication Dose Route Frequency Provider Last Rate Last Admin   technetium tetrofosmin (TC-MYOVIEW) injection 10.8 millicurie  10.8 millicurie Intravenous Once PRN Chilton Si, MD       No Known Allergies Objective:   Today's Vitals   05/22/23 0850  BP: 132/80  Pulse: 88  Temp: 97.6 F (36.4 C)  TempSrc: Temporal  SpO2: 100%  Weight: 220 lb (99.8 kg)  Height: 6\' 3"  (1.905 m)   Body mass index is 27.5 kg/m.   General: Well developed, well nourished. No acute distress. HEENT: Normocephalic, non-traumatic. PERRL, EOMI. Conjunctiva clear. External ears normal. EAC   and TMs normal bilaterally. Nose clear without congestion or rhinorrhea. Mucous membranes moist.   Oropharynx clear. Good dentition. Neck: Supple. No lymphadenopathy. No thyromegaly. Lungs: Clear to auscultation bilaterally. No wheezing, rales or rhonchi. CV: RRR without murmurs or rubs. Pulses 2+ bilaterally. Abdomen: Soft, non-tender. Bowel sounds positive, normal pitch and frequency. No   hepatosplenomegaly. No rebound or guarding. Back: Straight. Pain noted over lower right paralumbar muscles. Extremities: Full ROM. No joint swelling or tenderness. No edema noted. Skin: Warm and dry. No rashes. Psych: Alert and oriented. Normal mood and affect.  Health Maintenance Due  Topic Date Due   Zoster Vaccines- Shingrix (1 of 2) Never done   Colonoscopy  03/31/2023     Assessment & Plan:   Problem List Items Addressed This Visit       Other   Prediabetes   I will check an A1c today.      Relevant Orders   Hemoglobin A1c   Basic metabolic panel   Other Visit Diagnoses       Annual physical exam    -  Primary   Overall good health. Encourage a resumption of regular exercise. Reviewed indicated screenings and immunizations.     Screening for prostate cancer       I will check a PSA today.   Relevant Orders   PSA     Stress       Manages with relaxation techniques and hydroxyzine PRN.    Relevant Medications   hydrOXYzine (ATARAX) 10 MG tablet       Return in about 1 year (around 05/21/2024) for Annual preventative care.   Loyola Mast, MD

## 2023-07-07 ENCOUNTER — Encounter (HOSPITAL_BASED_OUTPATIENT_CLINIC_OR_DEPARTMENT_OTHER): Payer: Self-pay | Admitting: Family

## 2023-07-07 ENCOUNTER — Ambulatory Visit (HOSPITAL_BASED_OUTPATIENT_CLINIC_OR_DEPARTMENT_OTHER): Payer: Self-pay | Admitting: Family

## 2023-07-07 VITALS — BP 120/70 | HR 87 | Ht 75.0 in | Wt 216.5 lb

## 2023-07-07 DIAGNOSIS — Z9889 Other specified postprocedural states: Secondary | ICD-10-CM

## 2023-07-07 DIAGNOSIS — Z8679 Personal history of other diseases of the circulatory system: Secondary | ICD-10-CM

## 2023-07-07 DIAGNOSIS — R002 Palpitations: Secondary | ICD-10-CM

## 2023-07-07 DIAGNOSIS — I471 Supraventricular tachycardia, unspecified: Secondary | ICD-10-CM

## 2023-07-07 MED ORDER — ATENOLOL 25 MG PO TABS: 25.0000 mg | ORAL_TABLET | Freq: Two times a day (BID) | ORAL | 3 refills | Status: AC | PRN

## 2023-07-07 NOTE — Progress Notes (Signed)
 Cardiology Office Note:  .   Date:  07/07/2023  ID:  Larry Mathis, DOB 1960/12/25, MRN [id number] PCP: Loyola Mast, MD  [facility] Providers Cardiologist:  None Cardiology APP:  Alver Sorrow, NP    History of Present Illness: .   Larry Mathis is a 63 y.o. male with history of atrial fibrillation s/p ablation 06/2017, anxiety.  He had previous atrial fibrillation ablation with Dr. Johney Frame March 2019.  He has not been maintained on anticoagulation as his CHA2DS2-VASc score is 0.  He was seen 01/13/2021 noting atypical chest pain subsequent Lexiscan Myoview 01/15/2021 was normal study with no evidence of ischemia nor prior ischemia.  LVEF was normal 61%.  Monitor with predominantly normal sinus rhythm with 4 episodes of SVT with fastest 8 beats at a rate of 203 bpm which were asymptomatic.  At visit 11/2021 atenolol 25 mg as needed for palpitations provided.   He presents today for follow-up.  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 Notes he has had more issues with his back and has upcoming MRI through [facility].  He did have back surgery ~30 years ago.  Feeling well from a cardiac perspective and uses atenolol intermittently with good response and palpitations.  He does feel his as needed hydroxyzine also helps his anxiety which previously contributed to palpitations.  He did move to [place] about a year ago and enjoys working on his property.  ROS: Please see the history of present illness.    All other systems reviewed and are negative.   Studies Reviewed: Marland Kitchen   EKG Interpretation Date/Time:  Tuesday July 07 2023 11:32:29 EDT Ventricular Rate:  79 PR Interval:  116 QRS Duration:  88 QT Interval:  364 QTC Calculation: 417 R Axis:   78  Text Interpretation: Normal sinus rhythm  No acute ST/T wave changes. Confirmed by Gillian Shields ([id number]) on 07/07/2023 11:43:52 AM    Cardiac Studies & Procedures    ______________________________________________________________________________________________   STRESS TESTS  MYOCARDIAL PERFUSION IMAGING 01/15/2021  Narrative   The study is normal. Findings are consistent with no prior ischemia. The study is low risk.   No ST deviation was noted.   LV perfusion is normal. There is no evidence of ischemia. There is no evidence of infarction.   Left ventricular function is normal. Nuclear stress EF: 61 %. The left ventricular ejection fraction is normal (55-65%). End diastolic cavity size is normal. End systolic cavity size is normal.   Prior study available for comparison from 05/22/2009. Unable to view prior study.  Negative stress test with no evidence of ischemia.   ECHOCARDIOGRAM  ECHOCARDIOGRAM COMPLETE 11/24/2019  Narrative ECHOCARDIOGRAM REPORT    Patient Name:   Larry Mathis Date of Exam: 11/24/2019 Medical Rec #:  [id number]        Height:       75.0 in Accession #:    [id number]       Weight:       209.0 lb Date of Birth:  Jul 11, 1960        BSA:          2.236 m Patient Age:    59 years         BP:           130/90 mmHg Patient Gender: M                HR:  67 bpm. Exam Location:  [address]  Procedure: 2D Echo, 3D Echo, Cardiac Doppler, Color Doppler and Strain Analysis  Indications:    I48 Atrial fibrillation  History:        Patient has prior history of Echocardiogram examinations, most recent 06/11/2017. Arrythmias:Palpitation., Atrial Fibrillation and PAC, Signs/Symptoms:Shortness of Breath; Risk Factors:Former Smoker.  Sonographer:    Garald Braver, RDCS Referring Phys: [id number] Mariam Dollar [facility]  IMPRESSIONS   1. Left ventricular ejection fraction, by estimation, is 60%. The left ventricle has normal function. The left ventricle has no regional wall motion abnormalities. Left ventricular diastolic parameters were normal. 2. Right ventricular systolic function is normal. The right ventricular size is  normal. 3. The mitral valve is normal in structure. Trivial mitral valve regurgitation. No evidence of mitral stenosis. 4. The aortic valve is normal in structure. Aortic valve regurgitation is not visualized. No aortic stenosis is present. 5. The inferior vena cava is normal in size with greater than 50% respiratory variability, suggesting right atrial pressure of 3 mmHg.  Comparison(s): 06/11/17 EF 50-55%.  FINDINGS Left Ventricle: Left ventricular ejection fraction, by estimation, is 60%. The left ventricle has normal function. The left ventricle has no regional wall motion abnormalities. The left ventricular internal cavity size was normal in size. There is no left ventricular hypertrophy. Left ventricular diastolic parameters were normal.  Right Ventricle: The right ventricular size is normal. No increase in right ventricular wall thickness. Right ventricular systolic function is normal.  Left Atrium: Left atrial size was normal in size.  Right Atrium: Right atrial size was normal in size.  Pericardium: There is no evidence of pericardial effusion.  Mitral Valve: The mitral valve is normal in structure. Normal mobility of the mitral valve leaflets. Trivial mitral valve regurgitation. No evidence of mitral valve stenosis.  Tricuspid Valve: The tricuspid valve is normal in structure. Tricuspid valve regurgitation is not demonstrated. No evidence of tricuspid stenosis.  Aortic Valve: The aortic valve is normal in structure. Aortic valve regurgitation is not visualized. No aortic stenosis is present.  Pulmonic Valve: The pulmonic valve was normal in structure. Pulmonic valve regurgitation is trivial. No evidence of pulmonic stenosis.  Aorta: The aortic root is normal in size and structure.  Venous: The inferior vena cava is normal in size with greater than 50% respiratory variability, suggesting right atrial pressure of 3 mmHg.  IAS/Shunts: No atrial level shunt detected by color flow  Doppler.   LEFT VENTRICLE PLAX 2D LVIDd:         5.30 cm  Diastology LVIDs:         3.70 cm  LV e' lateral:   17.40 cm/s LV PW:         0.80 cm  LV E/e' lateral: 4.1 LV IVS:        0.80 cm  LV e' medial:    10.60 cm/s LVOT diam:     2.10 cm  LV E/e' medial:  6.7 LV SV:         90 LV SV Index:   40       2D Longitudinal Strain LVOT Area:     3.46 cm 2D Strain GLS (A2C):   -19.3 % 2D Strain GLS (A3C):   -18.4 % 2D Strain GLS (A4C):   -22.3 % 2D Strain GLS Avg:     -20.0 %  3D Volume EF: 3D EF:        60 % LV EDV:       156  ml LV ESV:       62 ml LV SV:        94 ml  RIGHT VENTRICLE RV Basal diam:  3.10 cm RV S prime:     11.00 cm/s TAPSE (M-mode): 2.3 cm  LEFT ATRIUM             Index       RIGHT ATRIUM           Index LA diam:        3.50 cm 1.57 cm/m  RA Pressure: 3.00 mmHg LA Vol (A2C):   31.6 ml 14.13 ml/m RA Area:     11.10 cm LA Vol (A4C):   32.7 ml 14.63 ml/m RA Volume:   25.80 ml  11.54 ml/m LA Biplane Vol: 32.6 ml 14.58 ml/m AORTIC VALVE LVOT Vmax:   127.00 cm/s LVOT Vmean:  90.800 cm/s LVOT VTI:    0.261 m  AORTA Ao Root diam: 3.70 cm Ao Asc diam:  3.20 cm  MITRAL VALVE               TRICUSPID VALVE Estimated RAP:  3.00 mmHg  MV E velocity: 70.60 cm/s  SHUNTS MV A velocity: 58.30 cm/s  Systemic VTI:  0.26 m MV E/A ratio:  1.21        Systemic Diam: 2.10 cm  Charlton Haws MD Electronically signed by Charlton Haws MD Signature Date/Time: 11/24/2019/9:11:16 AM    Final    MONITORS  LONG TERM MONITOR (3-14 DAYS) 02/05/2021  Narrative Sinus rhythm Rare premature atrial contractions and premature ventricular contractions 4 short episodes of nonsustained atrial tachycardia are noted, longest is 7 beats No afib No sustained arrhythmias No AV block or prolonged pauses   CT SCANS  CT CARDIAC SCORING (SELF PAY ONLY) 09/02/2013  Addendum 09/05/2013  8:59 AM ADDENDUM REPORT: 09/05/2013 08:56  ADDENDUM: OVER-READ INTERPRETATION  CT CHEST  The  following report is an over-read performed by radiologist Dr. Charline Bills of [facility] on 09/05/2013. This over-read does not include interpretation of cardiac or coronary anatomy or pathology. The CTA interpretation by the cardiologist is attached.  Visualized lungs are clear.  No suspicious pulmonary nodules.  No visualized thoracic lymphadenopathy.  Mild degenerative changes of the visualized thoracic spine.  IMPRESSION: No significant extracardiac findings.   Electronically Signed By: Charline Bills M.D. On: 09/05/2013 08:56  Narrative CLINICAL DATA:  Risk stratification  EXAM: Coronary Calcium Score  TECHNIQUE: The patient was scanned on a Siemens Sensation 16 slice scanner. Axial non-contrast 3mm slices were carried out through the heart. The data set was analyzed on a dedicated work station and scored using the Agatson method.  FINDINGS: Non-cardiac: No significant non cardiac findings on limited lung and soft tissue windows. See separate report from [facility].  Ascending Aorta:  3.2 cm  Pericardium: Normal  Coronary arteries: No calicum seen  IMPRESSION: Coronary calcium score of 0.  Charlton Haws  Electronically Signed: By: Charlton Haws M.D. On: 09/02/2013 11:21     ______________________________________________________________________________________________      Risk Assessment/Calculations:    CHA2DS2-VASc Score = 0   This indicates a 0.2% annual risk of stroke. The patient's score is based upon: CHF History: 0 HTN History: 0 Diabetes History: 0 Stroke History: 0 Vascular Disease History: 0 Age Score: 0 Gender Score: 0             Physical Exam:   VS:  BP 120/70 (BP Location: Right Arm, Patient Position: Sitting)  Pulse 87   Ht 6\' 3"  (1.905 m)   Wt 216 lb 8 oz (98.2 kg)   SpO2 97%   BMI 27.06 kg/m    Wt Readings from Last 3 Encounters:  07/07/23 216 lb 8 oz (98.2 kg)  05/22/23 220 lb (99.8  kg)  05/19/22 217 lb 3.2 oz (98.5 kg)    GEN: Well nourished, well developed in no acute distress NECK: No JVD; No carotid bruits CARDIAC: RRR, no murmurs, rubs, gallops RESPIRATORY:  Clear to auscultation without rales, wheezing or rhonchi  ABDOMEN: Soft, non-tender, non-distended EXTREMITIES:  No edema; No deformity   ASSESSMENT AND PLAN: .    PAF s/p ablation / SVT -no recurrent atrial fibrillation by ZIO monitor 12/2020.  Did show 4 episodes of SVT which were asymptomatic. Continue Atenolol 25mg  PRN for palpitations, refills provided.    Chest pain -Myoview 01/15/2021 low risk study with no evidence of ischemia.  No recurrence of chest pain. No indication for further workup at this time.          Dispo: follow up in 1 year  Signed, Alver Sorrow, NP

## 2023-07-07 NOTE — Patient Instructions (Signed)
 Medication Instructions:  Continue your current medications   A  refill of as-needed Atenolol was sent to your pharmacy  *If you need a refill on your cardiac medications before your next appointment, please call your pharmacy*  Follow-Up: At [facility], you and your health needs are our priority.  As part of our continuing mission to provide you with exceptional heart care, we have created designated Provider Care Teams.  These Care Teams include your primary Cardiologist (physician) and Advanced Practice Providers (APPs -  Physician Assistants and Nurse Practitioners) who all work together to provide you with the care you need, when you need it.  We recommend signing up for the patient portal called "MyChart".  Sign up information is provided on this After Visit Summary.  MyChart is used to connect with patients for Virtual Visits (Telemedicine).  Patients are able to view lab/test results, encounter notes, upcoming appointments, etc.  Non-urgent messages can be sent to your provider as well.   To learn more about what you can do with MyChart, go to [email or web address].    Your next appointment:   1 year(s)  Provider:   Gillian Shields, NP

## 2024-05-25 ENCOUNTER — Encounter: Payer: 59 | Admitting: Family Medicine

## 2024-06-08 ENCOUNTER — Encounter: Admitting: Family Medicine
